# Patient Record
Sex: Male | Born: 1960 | Race: White | Hispanic: No | Marital: Married | Smoking: Former smoker
Health system: Southern US, Community
[De-identification: ages and names within clinical notes are randomized; demographics above are authoritative.]

## PROBLEM LIST (undated history)

## (undated) DIAGNOSIS — R569 Unspecified convulsions: Secondary | ICD-10-CM

## (undated) DIAGNOSIS — Z86010 Personal history of colonic polyps: Secondary | ICD-10-CM

## (undated) DIAGNOSIS — F17200 Nicotine dependence, unspecified, uncomplicated: Secondary | ICD-10-CM

## (undated) HISTORY — DX: Nicotine dependence, unspecified, uncomplicated: F17.200

## (undated) HISTORY — DX: Unspecified convulsions: R56.9

## (undated) HISTORY — PX: TONSILLECTOMY AND ADENOIDECTOMY: SHX28

## (undated) HISTORY — PX: CARDIAC CATHETERIZATION: SHX172

## (undated) HISTORY — PX: APPENDECTOMY: SHX54

## (undated) HISTORY — DX: Personal history of colonic polyps: Z86.010

---

## 2010-08-23 ENCOUNTER — Emergency Department: Payer: Self-pay | Admitting: Emergency Medicine

## 2010-12-15 HISTORY — PX: MEDIAL COLLATERAL LIGAMENT REPAIR, KNEE: SHX2019

## 2012-11-08 ENCOUNTER — Encounter: Payer: Self-pay | Admitting: Family Medicine

## 2012-11-08 ENCOUNTER — Ambulatory Visit (INDEPENDENT_AMBULATORY_CARE_PROVIDER_SITE_OTHER): Payer: Managed Care, Other (non HMO) | Admitting: Family Medicine

## 2012-11-08 VITALS — BP 136/86 | HR 88 | Ht 72.25 in | Wt 213.0 lb

## 2012-11-08 DIAGNOSIS — F17200 Nicotine dependence, unspecified, uncomplicated: Secondary | ICD-10-CM

## 2012-11-08 DIAGNOSIS — Z79899 Other long term (current) drug therapy: Secondary | ICD-10-CM

## 2012-11-08 DIAGNOSIS — R5381 Other malaise: Secondary | ICD-10-CM

## 2012-11-08 DIAGNOSIS — R5383 Other fatigue: Secondary | ICD-10-CM

## 2012-11-08 DIAGNOSIS — G40909 Epilepsy, unspecified, not intractable, without status epilepticus: Secondary | ICD-10-CM

## 2012-11-08 DIAGNOSIS — M5412 Radiculopathy, cervical region: Secondary | ICD-10-CM

## 2012-11-08 DIAGNOSIS — M62838 Other muscle spasm: Secondary | ICD-10-CM

## 2012-11-08 DIAGNOSIS — M542 Cervicalgia: Secondary | ICD-10-CM

## 2012-11-08 LAB — CBC WITH DIFFERENTIAL/PLATELET
Eosinophils Absolute: 0.1 10*3/uL (ref 0.0–0.7)
Hemoglobin: 14.7 g/dL (ref 13.0–17.0)
Lymphocytes Relative: 21 % (ref 12–46)
Lymphs Abs: 3.5 10*3/uL (ref 0.7–4.0)
MCH: 31.9 pg (ref 26.0–34.0)
MCV: 92.6 fL (ref 78.0–100.0)
Monocytes Relative: 7 % (ref 3–12)
Neutrophils Relative %: 71 % (ref 43–77)
RBC: 4.61 MIL/uL (ref 4.22–5.81)
WBC: 16.2 10*3/uL — ABNORMAL HIGH (ref 4.0–10.5)

## 2012-11-08 MED ORDER — METHOCARBAMOL 500 MG PO TABS: 500.0000 mg | ORAL_TABLET | Freq: Three times a day (TID) | ORAL | Status: DC

## 2012-11-08 MED ORDER — CYCLOBENZAPRINE HCL 10 MG PO TABS: 5.0000 mg | ORAL_TABLET | Freq: Every evening | ORAL | Status: DC | PRN

## 2012-11-08 MED ORDER — HYDROCODONE-ACETAMINOPHEN 5-500 MG PO TABS: 1.0000 | ORAL_TABLET | Freq: Four times a day (QID) | ORAL | Status: DC | PRN

## 2012-11-08 MED ORDER — METHYLPREDNISOLONE 4 MG PO KIT: PACK | ORAL | Status: DC
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
## 2012-11-08 NOTE — Patient Instructions (Signed)
Stop the Ibuprofen.  Take robaxin just during day (less sedating muscle relaxant) and flexeril in the evening (will make you sleepy).  Try doing range of motion/stretching of the neck after applying heat for 15-20 minutes, 2-3 times daily, as well as massage  We are referring you to neurologist to further assess your seizures and medications.

## 2012-11-09 LAB — COMPREHENSIVE METABOLIC PANEL
Albumin: 4.7 g/dL (ref 3.5–5.2)
Alkaline Phosphatase: 64 U/L (ref 39–117)
BUN: 12 mg/dL (ref 6–23)
CO2: 30 mEq/L (ref 19–32)
Glucose, Bld: 93 mg/dL (ref 70–99)
Total Bilirubin: 0.3 mg/dL (ref 0.3–1.2)

## 2012-11-09 LAB — LEVETIRACETAM LEVEL: Keppra (Levetiracetam): 23.3 ug/mL (ref 5.0–30.0)

## 2012-11-09 LAB — TSH: TSH: 0.5 u[IU]/mL (ref 0.350–4.500)

## 2012-11-22 ENCOUNTER — Encounter: Payer: Self-pay | Admitting: Family Medicine

## 2012-11-22 ENCOUNTER — Ambulatory Visit (INDEPENDENT_AMBULATORY_CARE_PROVIDER_SITE_OTHER): Payer: Managed Care, Other (non HMO) | Admitting: Family Medicine

## 2012-11-22 VITALS — BP 130/84 | HR 72 | Ht 72.5 in | Wt 217.0 lb

## 2012-11-22 DIAGNOSIS — M62838 Other muscle spasm: Secondary | ICD-10-CM

## 2012-11-22 DIAGNOSIS — M25512 Pain in left shoulder: Secondary | ICD-10-CM

## 2012-11-22 DIAGNOSIS — M25519 Pain in unspecified shoulder: Secondary | ICD-10-CM

## 2012-11-22 DIAGNOSIS — M5412 Radiculopathy, cervical region: Secondary | ICD-10-CM

## 2012-11-22 DIAGNOSIS — M542 Cervicalgia: Secondary | ICD-10-CM

## 2012-11-22 MED ORDER — OXYCODONE-ACETAMINOPHEN 5-325 MG PO TABS: 1.0000 | ORAL_TABLET | Freq: Four times a day (QID) | ORAL | Status: DC | PRN

## 2012-11-22 NOTE — Patient Instructions (Addendum)
You have orders for x-rays in the computer--you can go to [facility] at your convenience).    We are referring you to physical therapy.  If your pain isn't improving/responding to therapy, then we will proceed with MRI.  You can discuss this with neurologist, since you will be seeing them after having started therapy, but you may need a little longer to determine if it is helping.
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
## 2012-11-23 ENCOUNTER — Ambulatory Visit
Admission: RE | Admit: 2012-11-23 | Discharge: 2012-11-23 | Disposition: A | Discharge status: Home / Self Care | Payer: Managed Care, Other (non HMO) | Source: Ambulatory Visit | Attending: Family Medicine | Admitting: Family Medicine

## 2012-11-23 DIAGNOSIS — M542 Cervicalgia: Secondary | ICD-10-CM

## 2012-11-23 DIAGNOSIS — M5412 Radiculopathy, cervical region: Secondary | ICD-10-CM

## 2012-11-23 DIAGNOSIS — M25512 Pain in left shoulder: Secondary | ICD-10-CM

## 2012-11-25 ENCOUNTER — Ambulatory Visit: Payer: Managed Care, Other (non HMO) | Admitting: Physical Therapy

## 2013-01-24 ENCOUNTER — Encounter: Payer: Self-pay | Admitting: Family Medicine

## 2013-01-24 ENCOUNTER — Ambulatory Visit (INDEPENDENT_AMBULATORY_CARE_PROVIDER_SITE_OTHER): Payer: Managed Care, Other (non HMO) | Admitting: Family Medicine

## 2013-01-24 VITALS — BP 134/86 | HR 92 | Ht 72.5 in | Wt 224.0 lb

## 2013-01-24 DIAGNOSIS — R6882 Decreased libido: Secondary | ICD-10-CM

## 2013-01-24 DIAGNOSIS — F17200 Nicotine dependence, unspecified, uncomplicated: Secondary | ICD-10-CM

## 2013-01-24 DIAGNOSIS — R5383 Other fatigue: Secondary | ICD-10-CM

## 2013-01-24 DIAGNOSIS — Z0000 Encounter for general adult medical examination without abnormal findings: Secondary | ICD-10-CM

## 2013-01-24 DIAGNOSIS — R7301 Impaired fasting glucose: Secondary | ICD-10-CM | POA: Insufficient documentation

## 2013-01-24 DIAGNOSIS — R195 Other fecal abnormalities: Secondary | ICD-10-CM

## 2013-01-24 DIAGNOSIS — R5381 Other malaise: Secondary | ICD-10-CM

## 2013-01-24 DIAGNOSIS — Z13220 Encounter for screening for lipoid disorders: Secondary | ICD-10-CM

## 2013-01-24 DIAGNOSIS — Z23 Encounter for immunization: Secondary | ICD-10-CM

## 2013-01-24 DIAGNOSIS — E782 Mixed hyperlipidemia: Secondary | ICD-10-CM | POA: Insufficient documentation

## 2013-01-24 DIAGNOSIS — Z125 Encounter for screening for malignant neoplasm of prostate: Secondary | ICD-10-CM

## 2013-01-24 DIAGNOSIS — Z1211 Encounter for screening for malignant neoplasm of colon: Secondary | ICD-10-CM

## 2013-01-24 LAB — GLUCOSE, RANDOM: Glucose, Bld: 161 mg/dL — ABNORMAL HIGH (ref 70–99)

## 2013-01-24 LAB — POCT URINALYSIS DIPSTICK
Leukocytes, UA: NEGATIVE
Nitrite, UA: NEGATIVE
Protein, UA: NEGATIVE
Urobilinogen, UA: NEGATIVE
pH, UA: 5

## 2013-01-24 LAB — PSA: PSA: 0.64 ng/mL (ref <=4.00)

## 2013-01-24 LAB — LIPID PANEL: Total CHOL/HDL Ratio: 8.5 Ratio
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
## 2013-01-24 NOTE — Patient Instructions (Addendum)
HEALTH MAINTENANCE RECOMMENDATIONS:  It is recommended that you get at least 30 minutes of aerobic exercise at least 5 days/week (for weight loss, you may need as much as 60-90 minutes). This can be any activity that gets your heart rate up. This can be divided in 10-15 minute intervals if needed, but try and build up your endurance at least once a week.  Weight bearing exercise is also recommended twice weekly.  Eat a healthy diet with lots of vegetables, fruits and fiber.  "Colorful" foods have a lot of vitamins (ie green vegetables, tomatoes, red peppers, etc).  Limit sweet tea, regular sodas and alcoholic beverages, all of which has a lot of calories and sugar.  Up to 2 alcoholic drinks daily may be beneficial for men (unless trying to lose weight, watch sugars).  Drink a lot of water.  Sunscreen of at least SPF 30 should be used on all sun-exposed parts of the skin when outside between the hours of 10 am and 4 pm (not just when at beach or pool, but even with exercise, golf, tennis, and yard work!)  Use a sunscreen that says "broad spectrum" so it covers both UVA and UVB rays, and make sure to reapply every 1-2 hours.  Remember to change the batteries in your smoke detectors when changing your clock times in the spring and fall.  Use your seat belt every time you are in a car, and please drive safely and not be distracted with cell phones and texting while driving.  Please schedule routine eye exam. Please quit smoking--you can use 1-800-QUITNOW or [facility]uit[facility] for free counseling to help quit smoking. Flu shots are recommended every fall  We are referring you for colonoscopy.

## 2013-01-25 DIAGNOSIS — R195 Other fecal abnormalities: Secondary | ICD-10-CM | POA: Insufficient documentation

## 2013-01-25 NOTE — Progress Notes (Signed)
Quick Note:  PATIENT INFORMED WORD FOR WORD AND HAS APT. FEB.17 AT 3 ______

## 2013-01-26 ENCOUNTER — Encounter: Payer: Self-pay | Admitting: Internal Medicine

## 2013-01-31 ENCOUNTER — Ambulatory Visit (INDEPENDENT_AMBULATORY_CARE_PROVIDER_SITE_OTHER): Payer: Managed Care, Other (non HMO) | Admitting: Family Medicine

## 2013-01-31 ENCOUNTER — Encounter: Payer: Self-pay | Admitting: Family Medicine

## 2013-01-31 VITALS — BP 130/80 | HR 88 | Ht 72.5 in | Wt 222.0 lb

## 2013-01-31 DIAGNOSIS — F17200 Nicotine dependence, unspecified, uncomplicated: Secondary | ICD-10-CM

## 2013-01-31 DIAGNOSIS — E291 Testicular hypofunction: Secondary | ICD-10-CM

## 2013-01-31 DIAGNOSIS — R7301 Impaired fasting glucose: Secondary | ICD-10-CM

## 2013-01-31 DIAGNOSIS — E782 Mixed hyperlipidemia: Secondary | ICD-10-CM

## 2013-01-31 LAB — POCT GLYCOSYLATED HEMOGLOBIN (HGB A1C): Hemoglobin A1C: 5.7

## 2013-01-31 NOTE — Progress Notes (Signed)
Chief Complaint  Patient presents with  . Follow-up    lab follow up.   He reports that his labwork done at his physical wasn't actually fasting-- he had sugar in his coffee that morning.  Hadn't eaten anything, just sugar in his coffee.  He presents today to discuss his elevated blood sugar and lipids.  His exercise includes being very active on the job.  He is a smoker.  Past Medical History  Diagnosis Date  . Seizure     first 2009, total of 5.  [place] [facility]aggard    Past Surgical History  Procedure Laterality Date  . Tonsillectomy and adenoidectomy  age 52  . Appendectomy    . Medial collateral ligament repair, knee  2012    LEFT   History   Social History  . Marital Status: Single    Spouse Name: N/A    Number of Children: N/A  . Years of Education: N/A   Occupational History  . 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 Smoking status: Current Every Day Smoker -- 1.00 packs/day    Types: Cigarettes  . Smokeless tobacco: Not on file     Comment: smoking since age 10  . Alcohol Use: Yes     Comment: states he will go months without drinking, drank this Friday. sporadic  . Drug Use: No  . Sexually Active: Yes -- Male partner(s)   Other Topics Concern  . Not on file   Social History Narrative   Lives with his Earlie Counts.  3 kids, live in [place]   Current Outpatient Prescriptions on File Prior to Visit  Medication Sig Dispense Refill  . carbamazepine (TEGRETOL XR) 200 MG 12 hr tablet Take 200 mg by mouth 2 (two) times daily.      . Levetiracetam (KEPPRA XR) 750 MG TB24 Take 1,500 mg by mouth 2 (two) times daily.      . methocarbamol (ROBAXIN) 500 MG tablet Take 1-2 tablets (500-1,000 mg total) by mouth 3 (three) times daily. Take as needed for muscle spasm, during day  30 tablet  0  . sertraline (ZOLOFT) 50 MG tablet Take 50 mg by mouth daily.       No current facility-administered medications on file  prior to visit.   Allergies  Allergen Reactions  . Penicillins Hives and Itching  . Vicodin (Hydrocodone-Acetaminophen) Itching    ROS:  +fatigue.  Denies snoring (very rare).  Feels tired a lot.  Denies chest pain, URI symtpoms, fevers, shortness of breath or other concerns.  PHYSICAL EXAM: BP 130/80  Pulse 88  Ht 6' 0.5" (1.842 m)  Wt 222 lb (100.699 kg)  BMI 29.68 kg/m2 Pleasant, very talkative male in no distress (frequently interrupts)  Labs: Lab Results  Component Value Date   HGBA1C 5.7 01/31/2013   Lab Results  Component Value Date   CHOL 289* 01/24/2013   HDL 34* 01/24/2013   LDLCALC Comment:   Not calculated due to Triglyceride >400. Suggest ordering Direct LDL (Unit Code: [address]).   Total Cholesterol/HDL Ratio:CHD Risk                        Coronary Heart Disease Risk Table                                        Men  Women          1/2 Average Risk              3.4        3.3              Average Risk              5.0        4.4           2X Average Risk              9.6        7.1           3X Average Risk             23.4       11.0 Use the calculated Patient Ratio above and the CHD Risk table  to determine the patient's CHD Risk. ATP III Classification (LDL):       < 100        mg/dL         Optimal      161 - 129     mg/dL         Near or Above Optimal      130 - 159     mg/dL         Borderline High      160 - 189     mg/dL         High       > 096        mg/dL         Very High   0/45/4098   TRIG 442* 01/24/2013   CHOLHDL 8.5 01/24/2013   Testosterone 310 Glucose 161  ASSESSMENT/PLAN:  Mixed hyperlipidemia - Diet reviewed extensively.  Encouraged tobacco cessation, low cholesterol, lowfat diet.  May need meds if not improved with diet alone.  recheck in 3-4 months. - Plan: Lipid panel  Impaired fasting glucose - A1c okay.  apparently wasn't truly fasting.  Diet reviewed at length. - Plan: HgB A1c, Glucose, random  Tobacco use disorder - smoking cessation  encouraged.  might help increase HDL (as well as cardiovascular benefits, as reviewed previously)  Hypogonadism male - level not low enough for insurance to cover replacement.  consider repeat in future.  weight loss encouraged.  Offered nutritionist--he will let us know if referral is desired.

## 2013-01-31 NOTE — Patient Instructions (Signed)
Lab Results  Component Value Date   HGBA1C 5.7 01/31/2013   Lab Results  Component Value Date   CHOL 289* 01/24/2013   HDL 34* 01/24/2013   LDLCALC Comment:   Not calculated due to Triglyceride >400. Suggest ordering Direct LDL (Unit Code: [address]).   Total Cholesterol/HDL Ratio:CHD Risk                        Coronary Heart Disease Risk Table                                        Men       Women          1/2 Average Risk              3.4        3.3              Average Risk              5.0        4.4           2X Average Risk              9.6        7.1           3X Average Risk             23.4       11.0 Use the calculated Patient Ratio above and the CHD Risk table  to determine the patient's CHD Risk. ATP III Classification (LDL):       < 100        mg/dL         Optimal      604 - 129     mg/dL         Near or Above Optimal      130 - 159     mg/dL         Borderline High      160 - 189     mg/dL         High       > 540        mg/dL         Very High   9/81/1914   TRIG 442* 01/24/2013   CHOLHDL 8.5 01/24/2013   Testosterone 310  Glucose 161  Fat and Cholesterol Control Diet Cholesterol levels in your body are determined significantly by your diet. Cholesterol levels may also be related to heart disease. The following material helps to explain this relationship and discusses what you can do to help keep your heart healthy. Not all cholesterol is bad. Low-density lipoprotein (LDL) cholesterol is the "bad" cholesterol. It may cause fatty deposits to build up inside your arteries. High-density lipoprotein (HDL) cholesterol is "good." It helps to remove the "bad" LDL cholesterol from your blood. Cholesterol is a very important risk factor for heart disease. Other risk factors are high blood pressure, smoking, stress, heredity, and weight. The heart muscle gets its supply of blood through the coronary arteries. If your LDL cholesterol is high and your HDL cholesterol is low, you are at risk for  having fatty deposits build up in your coronary arteries. This leaves less room through which blood can flow. Without sufficient blood and oxygen, the heart muscle cannot function properly  and you may feel chest pains (angina pectoris). When a coronary artery closes up entirely, a part of the heart muscle may die causing a heart attack (myocardial infarction). CHECKING CHOLESTEROL When your caregiver sends your blood to a lab to be examined for cholesterol, a complete lipid (fat) profile may be done. With this test, the total amount of cholesterol and levels of LDL and HDL are determined. Triglycerides are a type of fat that circulates in the blood. They can also be used to determine heart disease risk. The list below describes what the numbers should be: Test: Total Cholesterol.  Less than 200 mg/dl. Test: LDL "bad cholesterol."  Less than 100 mg/dl.  Less than 70 mg/dl if you are at very high risk of a heart attack or sudden cardiac death. Test: HDL "good cholesterol."  Greater than 50 mg/dl for women.  Greater than 40 mg/dl for men. Test: Triglycerides.  Less than 150 mg/dl. CONTROLLING CHOLESTEROL WITH DIET Although exercise and lifestyle factors are important, your diet is key. That is because certain foods are known to raise cholesterol and others to lower it. The goal is to balance foods for their effect on cholesterol and more importantly, to replace saturated and trans fat with other types of fat, such as monounsaturated fat, polyunsaturated fat, and omega-3 fatty acids. On average, a person should consume no more than 15 to 17 g of saturated fat daily. Saturated and trans fats are considered "bad" fats, and they will raise LDL cholesterol. Saturated fats are primarily found in animal products such as meats, butter, and cream. However, that does not mean you need to give up all your favorite foods. Today, there are good tasting, low-fat, low-cholesterol substitutes for most of the  things you like to eat. Choose low-fat or nonfat alternatives. Choose round or loin cuts of red meat. These types of cuts are lowest in fat and cholesterol. Chicken (without the skin), fish, veal, and ground [place] breast are great choices. Eliminate fatty meats, such as hot dogs and salami. Even shellfish have little or no saturated fat. Have a 3 oz (85 g) portion when you eat lean meat, poultry, or fish. Trans fats are also called "partially hydrogenated oils." They are oils that have been scientifically manipulated so that they are solid at room temperature resulting in a longer shelf life and improved taste and texture of foods in which they are added. Trans fats are found in stick margarine, some tub margarines, cookies, crackers, and baked goods.  When baking and cooking, oils are a great substitute for butter. The monounsaturated oils are especially beneficial since it is believed they lower LDL and raise HDL. The oils you should avoid entirely are saturated tropical oils, such as coconut and palm.  Remember to eat a lot from food groups that are naturally free of saturated and trans fat, including fish, fruit, vegetables, beans, grains (barley, rice, couscous, bulgur wheat), and pasta (without cream sauces).  IDENTIFYING FOODS THAT LOWER CHOLESTEROL  Soluble fiber may lower your cholesterol. This type of fiber is found in fruits such as apples, vegetables such as broccoli, potatoes, and carrots, legumes such as beans, peas, and lentils, and grains such as barley. Foods fortified with plant sterols (phytosterol) may also lower cholesterol. You should eat at least 2 g per day of these foods for a cholesterol lowering effect.  Read package labels to identify low-saturated fats, trans fat free, and low-fat foods at the supermarket. Select cheeses that have only 2 to 3  g saturated fat per ounce. Use a heart-healthy tub margarine that is free of trans fats or partially hydrogenated oil. When buying baked  goods (cookies, crackers), avoid partially hydrogenated oils. Breads and muffins should be made from whole grains (whole-wheat or whole oat flour, instead of "flour" or "enriched flour"). Buy non-creamy canned soups with reduced salt and no added fats.  FOOD PREPARATION TECHNIQUES  Never deep-fry. If you must fry, either stir-fry, which uses very little fat, or use non-stick cooking sprays. When possible, broil, bake, or roast meats, and steam vegetables. Instead of putting butter or margarine on vegetables, use lemon and herbs, applesauce, and cinnamon (for squash and sweet potatoes), nonfat yogurt, salsa, and low-fat dressings for salads.  LOW-SATURATED FAT / LOW-FAT FOOD SUBSTITUTES Meats / Saturated Fat (g)  Avoid: Steak, marbled (3 oz/85 g) / 11 g  Choose: Steak, lean (3 oz/85 g) / 4 g  Avoid: Hamburger (3 oz/85 g) / 7 g  Choose: Hamburger, lean (3 oz/85 g) / 5 g  Avoid: Ham (3 oz/85 g) / 6 g  Choose: Ham, lean cut (3 oz/85 g) / 2.4 g  Avoid: Chicken, with skin, dark meat (3 oz/85 g) / 4 g  Choose: Chicken, skin removed, dark meat (3 oz/85 g) / 2 g  Avoid: Chicken, with skin, light meat (3 oz/85 g) / 2.5 g  Choose: Chicken, skin removed, light meat (3 oz/85 g) / 1 g Dairy / Saturated Fat (g)  Avoid: Whole milk (1 cup) / 5 g  Choose: Low-fat milk, 2% (1 cup) / 3 g  Choose: Low-fat milk, 1% (1 cup) / 1.5 g  Choose: Skim milk (1 cup) / 0.3 g  Avoid: Hard cheese (1 oz/28 g) / 6 g  Choose: Skim milk cheese (1 oz/28 g) / 2 to 3 g  Avoid: Cottage cheese, 4% fat (1 cup) / 6.5 g  Choose: Low-fat cottage cheese, 1% fat (1 cup) / 1.5 g  Avoid: Ice cream (1 cup) / 9 g  Choose: Sherbet (1 cup) / 2.5 g  Choose: Nonfat frozen yogurt (1 cup) / 0.3 g  Choose: Frozen fruit bar / trace  Avoid: Whipped cream (1 tbs) / 3.5 g  Choose: Nondairy whipped topping (1 tbs) / 1 g Condiments / Saturated Fat (g)  Avoid: Mayonnaise (1 tbs) / 2 g  Choose: Low-fat mayonnaise (1 tbs) / 1  g  Avoid: Butter (1 tbs) / 7 g  Choose: Extra light margarine (1 tbs) / 1 g  Avoid: Coconut oil (1 tbs) / 11.8 g  Choose: Olive oil (1 tbs) / 1.8 g  Choose: Corn oil (1 tbs) / 1.7 g  Choose: Safflower oil (1 tbs) / 1.2 g  Choose: Sunflower oil (1 tbs) / 1.4 g  Choose: Soybean oil (1 tbs) / 2.4 g  Choose: Canola oil (1 tbs) / 1 g Document Released: 12/01/2005 Document Revised: 02/23/2012 Document Reviewed: 05/22/2011 [facility] Patient Information 2013 [place], [place].  Start taking 3000-4000 mg of omega-3 fish oil daily. Avoid fried foods, and limit sweets and cut back on carbs (using "brown" carbs vs white)  Return to recheck cholesterol and sugar in 3-4 months.  If still elevated, we will likely need to start medication.  Please make sure to have NOTHING except BLACK coffee or water prior to your labs.

## 2013-02-28 ENCOUNTER — Ambulatory Visit (AMBULATORY_SURGERY_CENTER): Payer: Managed Care, Other (non HMO) | Admitting: *Deleted

## 2013-02-28 ENCOUNTER — Encounter: Payer: Self-pay | Admitting: Internal Medicine

## 2013-02-28 VITALS — Ht 73.0 in | Wt 223.8 lb

## 2013-02-28 DIAGNOSIS — Z1211 Encounter for screening for malignant neoplasm of colon: Secondary | ICD-10-CM

## 2013-02-28 MED ORDER — NA SULFATE-K SULFATE-MG SULF 17.5-3.13-1.6 GM/177ML PO SOLN: ORAL | Status: DC

## 2013-03-07 ENCOUNTER — Encounter: Payer: Self-pay | Admitting: Family Medicine

## 2013-03-07 ENCOUNTER — Ambulatory Visit (INDEPENDENT_AMBULATORY_CARE_PROVIDER_SITE_OTHER): Payer: Managed Care, Other (non HMO) | Admitting: Family Medicine

## 2013-03-07 VITALS — BP 138/88 | HR 76 | Temp 97.9°F | Ht 73.75 in | Wt 224.0 lb

## 2013-03-07 DIAGNOSIS — F17200 Nicotine dependence, unspecified, uncomplicated: Secondary | ICD-10-CM

## 2013-03-07 DIAGNOSIS — J019 Acute sinusitis, unspecified: Secondary | ICD-10-CM

## 2013-03-07 MED ORDER — AZITHROMYCIN 250 MG PO TABS: ORAL_TABLET | ORAL | Status: AC

## 2013-03-07 NOTE — Patient Instructions (Addendum)
Drink plenty of fluids. Continue to use Mucinex-D as directed. If yours isn't Mucinex-D, what you want to take is guaifenesin (2400 mg/day) and decongestant--either pseudoephedrine or phenylephrine. You may use ibuprofen and/or tylenol as needed for pain Take the antibiotics as directed--it might take 5-6 days to really start helping, call us on day 10 if you aren't completely better (even though you are finished with the antibiotics on day 5, it continues to work for 10 days total)  Please try and quit smoking--start thinking about why/when you smoke (habit, boredom, stress) in order to come up with effective strategies to cut back or quit. Available resources to help you quit include free counseling through [facility] Quitline ([place]Quitline.com or 1-800-QUITNOW), smoking cessation classes through [facility] (call to find out schedule), over-the-counter nicotine replacements, and e-cigarettes (although this may not help break the hand-mouth habit).  Many insurance companies also have smoking cessation programs (which may decrease the cost of patches, meds if enrolled).  If these methods are not effective for you, and you are motivated to quit, return to discuss the possibility of prescription medications.

## 2013-03-07 NOTE — Progress Notes (Signed)
Chief Complaint  Patient presents with  . sinus infection    sinus infection since last monday. hurts under right eye   HPI:  Felt a tickle starting in his throat last week, then developed into head congestion--"cotton in ears, balloon head", pain under both eyes.  Nasal mucus is yellow.  Denies sore throat, only slight cough.  Some intermittent R ear pain, hurting along with the sinus pressure.  Used alka selzer cold and flu, but wasn't helpful, so changed to Mucinex--doesn't think it had decongestant.  He continues to smoke, although has been smoking less since he has been sick.  Past Medical History  Diagnosis Date  . Seizure     first 2009, total of 5. last seizure 11/13  . Smoker    History   Social History  . Marital Status: Single    Spouse Name: N/A    Number of Children: N/A  . Years of Education: N/A   Occupational History  . 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 Smoking status: Current Every Day Smoker -- 1.00 packs/day    Types: Cigarettes  . Smokeless tobacco: Never Used     Comment: smoking since age 52  . Alcohol Use: Yes     Comment: states he will go months without drinking, drank this Friday. sporadic  . Drug Use: No  . Sexually Active: Yes -- Male partner(s)   Other Topics Concern  . Not on file   Social History Narrative   Lives with his Earlie Counts.  3 kids, live in [place]   Current outpatient prescriptions:carbamazepine (TEGRETOL XR) 200 MG 12 hr tablet, Take 200 mg by mouth 2 (two) times daily., Disp: , Rfl: ;  Ibuprofen (ADVIL) 200 MG CAPS, Take by mouth as needed., Disp: , Rfl: ;  Levetiracetam (KEPPRA XR) 750 MG TB24, Take 1,500 mg by mouth 2 (two) times daily., Disp: , Rfl: ;  Multiple Vitamin (MULTIVITAMIN) tablet, Take 1 tablet by mouth daily., Disp: , Rfl:  Pseudoephedrine-Guaifenesin (MUCINEX D PO), Take by mouth., Disp: , Rfl: ;  Na Sulfate-K Sulfate-Mg Sulf (SUPREP BOWEL  PREP) SOLN, suprep as directed.  No substitutions, Disp: 354 mL, Rfl: 0  Allergies  Allergen Reactions  . Penicillins Hives and Itching  . Vicodin (Hydrocodone-Acetaminophen) Itching   ROS:  Denies headaches (other than sinus), dizziness, chest pain, shortness of breath, nausea, vomiting, diarrhea, skin rash, myalgias or other concerns.  PHYSICAL EXAM: BP 138/88  Pulse 76  Temp(Src) 97.9 F (36.6 C) (Oral)  Ht 6' 1.75" (1.873 m)  Wt 224 lb (101.606 kg)  BMI 28.96 kg/m2 Well developed male, in no distress.  Appears frustrated that he isn't feeling well. HEENT:  PERRL, EOMI, conjunctiva clear.  TM's and EAC's normal.  OP clear. Nasal mucosa mod-severely edematous with some erythema. No purulence noted.  +TTP bilateral maxillary sinuses Neck: no lymphadenopathy Heart: regular rate and rhythm without murmur Lungs: clear bilaterally Skin: no rash  ASSESSMENT/PLAN:  Sinusitis, acute - Plan: azithromycin (ZITHROMAX Z-PAK) 250 MG tablet  Tobacco use disorder  Drink plenty of fluids. Continue to use Mucinex-D as directed. If yours isn't Mucinex-D, what you want to take is guaifenesin (2400 mg/day) and decongestant--either pseudoephedrine or phenylephrine. You may use ibuprofen and/or tylenol as needed for pain Take the antibiotics as directed--it might take 5-6 days to really start helping, call us on day 10 if you aren't completely better (even though you are finished with the antibiotics on  day 5, it continues to work for 10 days total)  Encouraged to quit smoking

## 2013-03-14 ENCOUNTER — Ambulatory Visit (AMBULATORY_SURGERY_CENTER): Payer: Managed Care, Other (non HMO) | Admitting: Internal Medicine

## 2013-03-14 ENCOUNTER — Encounter: Payer: Self-pay | Admitting: Internal Medicine

## 2013-03-14 VITALS — BP 113/70 | HR 67 | Temp 96.9°F | Resp 13 | Ht 73.0 in | Wt 223.0 lb

## 2013-03-14 DIAGNOSIS — K5730 Diverticulosis of large intestine without perforation or abscess without bleeding: Secondary | ICD-10-CM

## 2013-03-14 DIAGNOSIS — D126 Benign neoplasm of colon, unspecified: Secondary | ICD-10-CM

## 2013-03-14 DIAGNOSIS — Z1211 Encounter for screening for malignant neoplasm of colon: Secondary | ICD-10-CM

## 2013-03-14 MED ORDER — SODIUM CHLORIDE 0.9 % IV SOLN: 500.0000 mL | INTRAVENOUS | Status: DC

## 2013-03-14 NOTE — Patient Instructions (Addendum)
I found and removed one polyp today. It looks benign.  You also have diverticulosis - a common condition that usually does not cause problems.  I will let you know pathology results and when to have another routine colonoscopy by mail.  Thank you for choosing me and [facility].  Iva Boop, MD, [facility]  Discharge instructions given with verbal understanding. Handouts on polyps and diverticulosis. Resume previous medications. YOU HAD AN ENDOSCOPIC PROCEDURE TODAY AT THE [facility]: Refer to the procedure report that was given to you for any specific questions about what was found during the examination.  If the procedure report does not answer your questions, please call your gastroenterologist to clarify.  If you requested that your care partner not be given the details of your procedure findings, then the procedure report has been included in a sealed envelope for you to review at your convenience later.  YOU SHOULD EXPECT: Some feelings of bloating in the abdomen. Passage of more gas than usual.  Walking can help get rid of the air that was put into your GI tract during the procedure and reduce the bloating. If you had a lower endoscopy (such as a colonoscopy or flexible sigmoidoscopy) you may notice spotting of blood in your stool or on the toilet paper. If you underwent a bowel prep for your procedure, then you may not have a normal bowel movement for a few days.  DIET: Your first meal following the procedure should be a light meal and then it is ok to progress to your normal diet.  A half-sandwich or bowl of soup is an example of a good first meal.  Heavy or fried foods are harder to digest and may make you feel nauseous or bloated.  Likewise meals heavy in dairy and vegetables can cause extra gas to form and this can also increase the bloating.  Drink plenty of fluids but you should avoid alcoholic beverages for 24 hours.  ACTIVITY: Your care partner should take  you home directly after the procedure.  You should plan to take it easy, moving slowly for the rest of the day.  You can resume normal activity the day after the procedure however you should NOT DRIVE or use heavy machinery for 24 hours (because of the sedation medicines used during the test).    SYMPTOMS TO REPORT IMMEDIATELY: A gastroenterologist can be reached at any hour.  During normal business hours, 8:30 AM to 5:00 PM Monday through Friday, call [phone].  After hours and on weekends, please call the GI answering service at [phone] who will take a message and have the physician on call contact you.   Following lower endoscopy (colonoscopy or flexible sigmoidoscopy):  Excessive amounts of blood in the stool  Significant tenderness or worsening of abdominal pains  Swelling of the abdomen that is new, acute  Fever of 100F or higher  FOLLOW UP: If any biopsies were taken you will be contacted by phone or by letter within the next 1-3 weeks.  Call your gastroenterologist if you have not heard about the biopsies in 3 weeks.  Our staff will call the home number listed on your records the next business day following your procedure to check on you and address any questions or concerns that you may have at that time regarding the information given to you following your procedure. This is a courtesy call and so if there is no answer at the home number and we have not heard  from you through the emergency physician on call, we will assume that you have returned to your regular daily activities without incident.  SIGNATURES/CONFIDENTIALITY: You and/or your care partner have signed paperwork which will be entered into your electronic medical record.  These signatures attest to the fact that that the information above on your After Visit Summary has been reviewed and is understood.  Full responsibility of the confidentiality of this discharge information lies with you and/or your  care-partner.

## 2013-03-14 NOTE — Op Note (Signed)
[facility] [address] [place], [address]   COLONOSCOPY PROCEDURE REPORT  PATIENT: Nicholas Nguyen, Nicholas Nguyen  MR#: [id number] BIRTHDATE: August 17, 1961 , 51  yrs. old GENDER: Male ENDOSCOPIST: Iva Boop, MD, [facility] REFERRED Edison Simon, M.D. PROCEDURE DATE:  03/14/2013 PROCEDURE:   Colonoscopy with snare polypectomy ASA CLASS:   Class II INDICATIONS:average risk screening. MEDICATIONS: propofol (Diprivan) 500mg  IV, MAC sedation, administered by CRNA, and These medications were titrated to patient response per physician's verbal order  DESCRIPTION OF PROCEDURE:   After the risks benefits and alternatives of the procedure were thoroughly explained, informed consent was obtained.  A digital rectal exam revealed no abnormalities of the rectum, A digital rectal exam revealed no prostatic nodules, and A digital rectal exam revealed the prostate was not enlarged.   The LB [id number]  endoscope was introduced through the anus and advanced to the cecum, which was identified by both the appendix and ileocecal valve. No adverse events experienced.   The quality of the prep was Suprep excellent The instrument was then slowly withdrawn as the colon was fully examined.      COLON FINDINGS: A polypoid shaped sessile polyp measuring 1 cm in size was found in the sigmoid colon.  A polypectomy was performed using snare cautery.  The resection was complete and the polyp tissue was completely retrieved.   Moderate diverticulosis was noted in the sigmoid colon.   The colon mucosa was otherwise normal.   A right colon retroflexion was performed.  Retroflexed views revealed no abnormalities. The time to cecum=6 minutes 39 seconds.  Withdrawal time=11 minutes 45 seconds.  The scope was withdrawn and the procedure completed. COMPLICATIONS: There were no complications.  ENDOSCOPIC IMPRESSION: 1.   Sessile polyp measuring 1 cm in size was found in the sigmoid colon; polypectomy  was performed using snare cautery 2.   Moderate diverticulosis was noted in the sigmoid colon 3.   The colon mucosa was otherwise normal - excellent prep  RECOMMENDATIONS: 1.  Timing of repeat colonoscopy will be determined by pathology findings. 2.  Hold aspirin, aspirin products, and anti-inflammatory medication for 1 week.   eSigned:  Iva Boop, MD, [facility] 03/14/2013 9:00 AM   cc: Joselyn Arrow, MD and The Patient

## 2013-03-14 NOTE — Progress Notes (Signed)
Patient did not experience any of the following events: a burn prior to discharge; a fall within the facility; wrong site/side/patient/procedure/implant event; or a hospital transfer or hospital admission upon discharge from the facility. (G8907) Patient did not have preoperative order for IV antibiotic SSI prophylaxis. (G8918)  

## 2013-03-14 NOTE — Progress Notes (Signed)
Called to room to assist during endoscopic procedure.  Patient ID and intended procedure confirmed with present staff. Received instructions for my participation in the procedure from the performing physician.  

## 2013-03-15 ENCOUNTER — Telehealth: Payer: Self-pay | Admitting: *Deleted

## 2013-03-15 NOTE — Telephone Encounter (Signed)
  Follow up Call-  Call back number 03/14/2013  Post procedure Call Back phone  # [phone]  Permission to leave phone message Yes     Patient questions:  Do you have a fever, pain , or abdominal swelling? yes Pain Score  2 *  Have you tolerated food without any problems? yes  Have you been able to return to your normal activities? yes  Do you have any questions about your discharge instructions: Diet   no Medications  no Follow up visit  no  Do you have questions or concerns about your Care? no  Actions: * If pain score is 4 or above: No action needed, pain <4.

## 2013-03-20 ENCOUNTER — Encounter: Payer: Self-pay | Admitting: Internal Medicine

## 2013-03-20 DIAGNOSIS — Z860100 Personal history of colon polyps, unspecified: Secondary | ICD-10-CM

## 2013-03-20 HISTORY — DX: Personal history of colonic polyps: Z86.010

## 2013-03-20 HISTORY — DX: Personal history of colon polyps, unspecified: Z86.0100

## 2013-03-20 NOTE — Progress Notes (Signed)
Quick Note:  1cm tubulovillous adenoma Repeat colonoscopy about 03/2016 ______

## 2013-04-04 ENCOUNTER — Ambulatory Visit (INDEPENDENT_AMBULATORY_CARE_PROVIDER_SITE_OTHER): Payer: Managed Care, Other (non HMO) | Admitting: Family Medicine

## 2013-04-04 ENCOUNTER — Encounter: Payer: Self-pay | Admitting: Family Medicine

## 2013-04-04 VITALS — Temp 98.5°F | Ht 75.75 in | Wt 219.0 lb

## 2013-04-04 DIAGNOSIS — L259 Unspecified contact dermatitis, unspecified cause: Secondary | ICD-10-CM

## 2013-04-04 MED ORDER — PREDNISONE 10 MG PO TABS: ORAL_TABLET | ORAL | Status: DC

## 2013-04-04 NOTE — Progress Notes (Signed)
Chief Complaint  Patient presents with  . Rash    first noticed this past Thursday and is getting increasingly worse.   4 days ago first noticed a few spots on his arms. These were itchy.  It has progressively spread, but much worse today. This morning he noticed some red spots on his feet, but now the top of his left foot is all blistered, weeping and feels like it is on fire.  Today has noticed rash over abdomen, back.  It started on the forearms near his posterior elbows, bilaterally.  He is itchy everywhere, painful/burning.  Denies any recent yardwork, new exposures, soaps, detergents, no travel. Hasn't worn sandals.  Has an indoor cat--hasn't been outside, no new topical meds for cat.  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  He hasn't used any OTC meds.  No change in his medications (no change in manufacturer).  Past Medical History  Diagnosis Date  . Seizure     first 2009, total of 5. last seizure 11/13  . Smoker   . Personal history of colonic adenoma 03/20/2013   Past Surgical History  Procedure Laterality Date  . Tonsillectomy and adenoidectomy  age 51  . Appendectomy    . Medial collateral ligament repair, knee  2012    LEFT   Current Outpatient Prescriptions on File Prior to Visit  Medication Sig Dispense Refill  . carbamazepine (TEGRETOL XR) 200 MG 12 hr tablet Take 200 mg by mouth 2 (two) times daily.      . Levetiracetam (KEPPRA XR) 750 MG TB24 Take 1,500 mg by mouth 2 (two) times daily.      . Multiple Vitamin (MULTIVITAMIN) tablet Take 1 tablet by mouth daily.      . Ibuprofen (ADVIL) 200 MG CAPS Take by mouth as needed.       No current facility-administered medications on file prior to visit.   Allergies  Allergen Reactions  . Penicillins Hives and Itching  . Vicodin (Hydrocodone-Acetaminophen) Itching   ROS:  Denies fevers, URI symptoms, shortness of breath, nausea, vomiting,  bowel changes.  Denies seizures, headaches, dizziness, myalgias or other concerns.  PHYSICAL EXAM: Temp(Src) 98.5 F (36.9 C) (Oral)  Ht 6' 3.75" (1.924 m)  Wt 219 lb (99.338 kg)  BMI 26.84 kg/m2  Appears agitated, uncomfortable, itching all over.  Skin: Fine papular rash scattered across abdomen and back, without excoriations (a few lesions on arms are excoriated/scabbed). No weeping or erythema. Most affected area is top of left foot--these lesions are vesicular, weeping.  Mild erythema but no edema, no proximal spread of erythema.    ASSESSMENT/PLAN:  Contact dermatitis - Plan: predniSONE (DELTASONE) 10 MG tablet  Unable to determine from history the cause of this dermatitis, but given the vesicles and pruritis, suspect allergic reaction.  Do not suspect infection.  Cannot r/o allergic reaction to medication, but history doesn't support this (no change in meds)  Treat with oral steroids.  Risks and side effects were reviewed in detail.  Follow up if symptoms, persist, worsen.  Other supportive measures were reviewed.  Prednisone 60/50/40/30/30/20/20/20/10/10/5/5  F/u prn

## 2013-04-04 NOTE — Patient Instructions (Addendum)
Prednisone 10mg  tablet  Take 6 tablets all at once today. Then tomorrow morning take 5 tablets. (Tuesday)--take with food. Wednesday morning take 4 tablets Thursday morning that 3 tablets Fri--take 3 Saturday--take 2 Sunday take 2 Monday take 2 Tuesday take 1 tab Wed--1 tab Thur and Fri--take 1/2 tablet each day.  You can use tylenol if needed for pain.  Avoid ibuprofen and aleve as this will bother your stomach. You can use benadryl or zyrtec if needed for itching, or to help you sleep, if needed.  Use topical antibacterial ointment to open areas of skin (especially left foot).

## 2013-04-15 ENCOUNTER — Telehealth: Payer: Self-pay | Admitting: Family Medicine

## 2013-04-15 NOTE — Telephone Encounter (Signed)
If no benefit from the prednisone, he needs to be seen.  I recommend derm referral--see if someone can see him today (if he is willing), or see if another provider in office can see him

## 2013-04-18 ENCOUNTER — Telehealth: Payer: Self-pay | Admitting: *Deleted

## 2013-04-18 NOTE — Telephone Encounter (Signed)
Spoke with patient and apologized profusely about him not getting a call back this past Friday. He did tell me that he went to the ER Saturday morning and is being treated for scabies. Just an FYI. Thanks.

## 2013-04-18 NOTE — Telephone Encounter (Signed)
Noted.  That was in my differential, especially when no response to treatment for contact derm. Didn't look like scabies at last visit.  ER must have been in [place], no notes in system

## 2013-05-02 ENCOUNTER — Other Ambulatory Visit: Payer: Managed Care, Other (non HMO)

## 2013-06-19 IMAGING — CR DG SHOULDER 2+V*L*
3 series · 3 of 3 positions shown · non-contrast
Comparison: None.

CLINICAL DATA: Fall 10/25/2012.  Shoulder pain.

LEFT SHOULDER - 2+ VIEW

[view not recorded (1 of 3)]
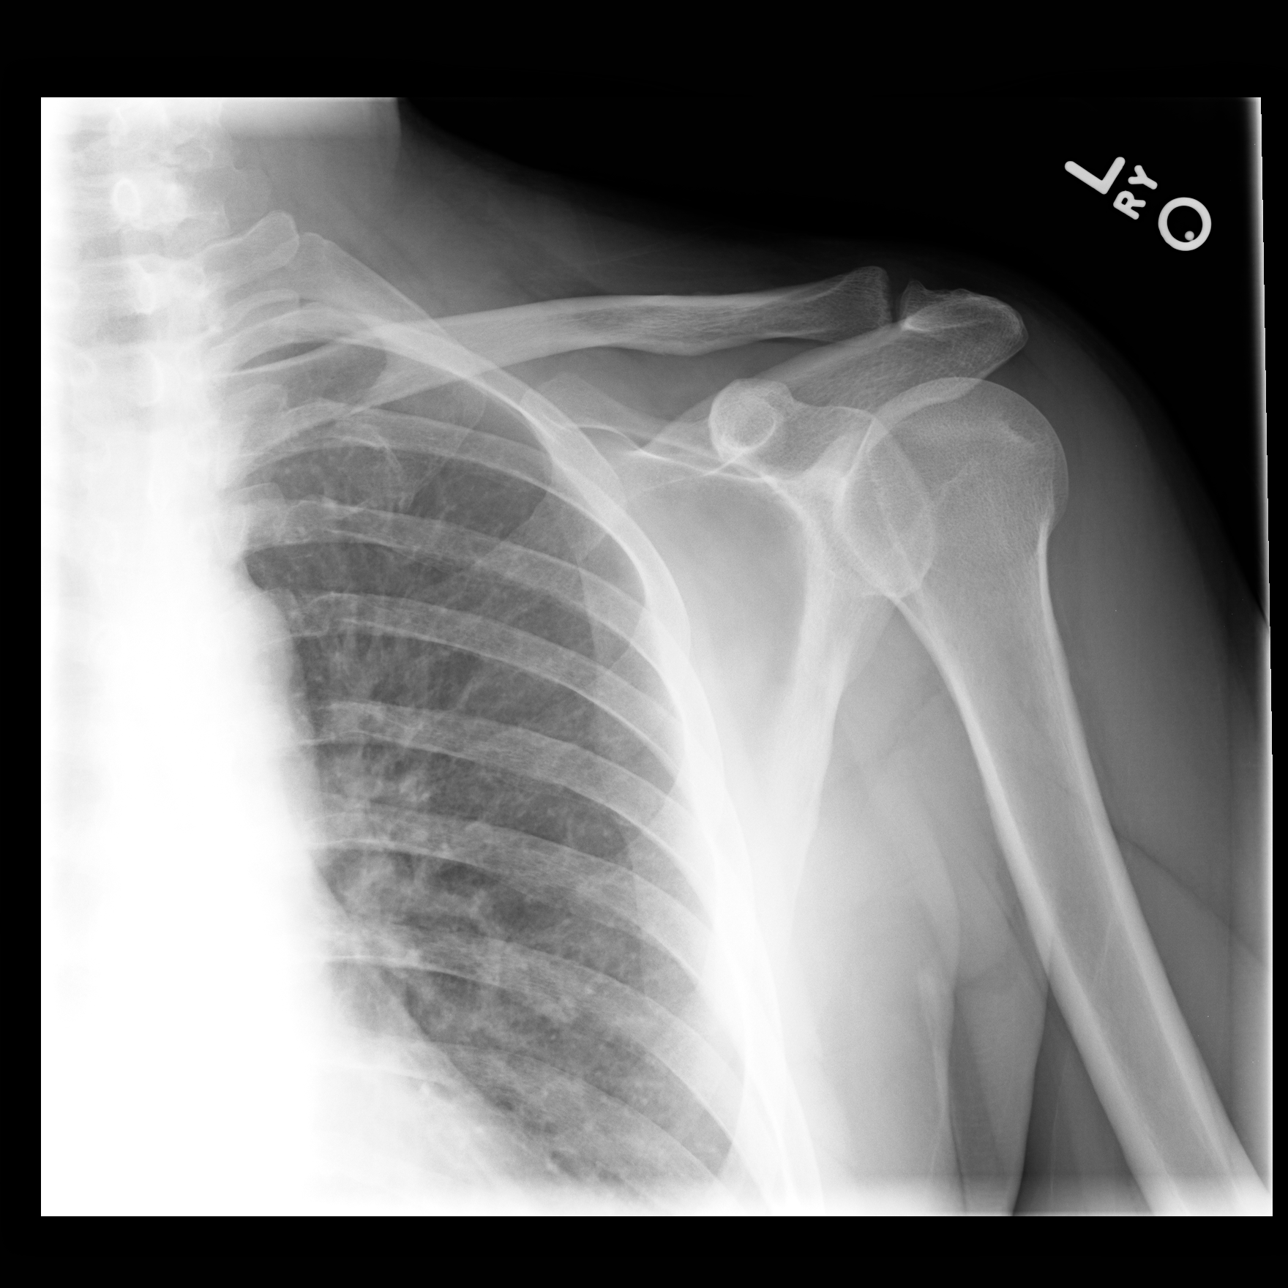

[view not recorded (2 of 3)]
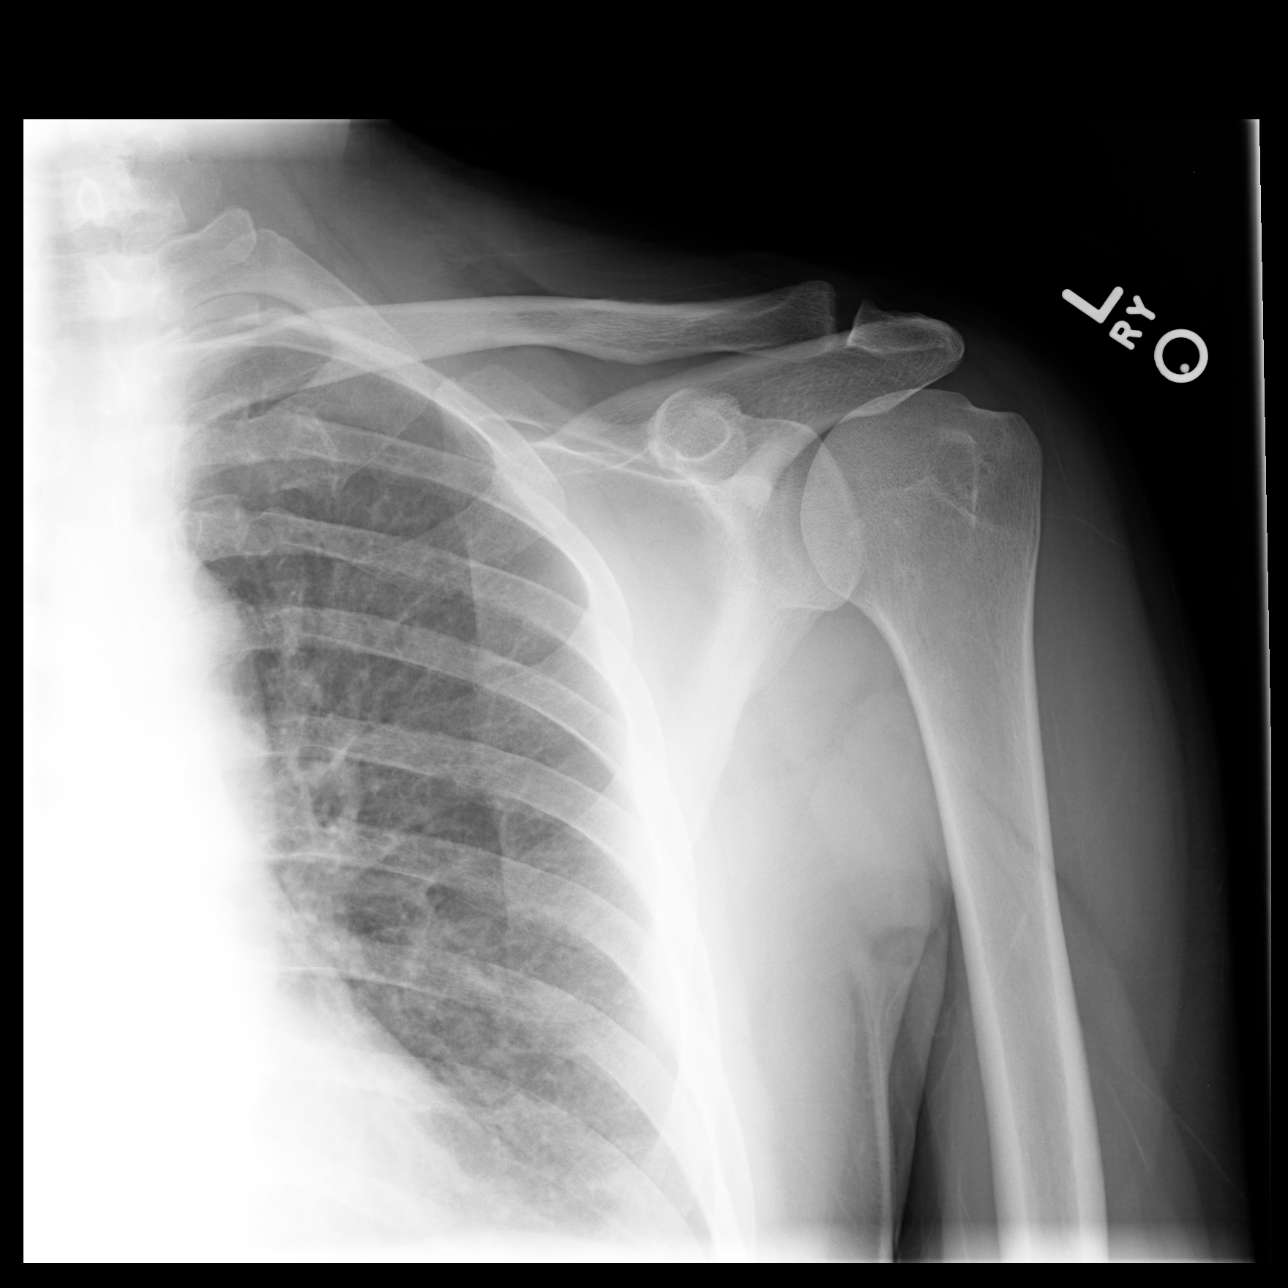

[view not recorded (3 of 3)]
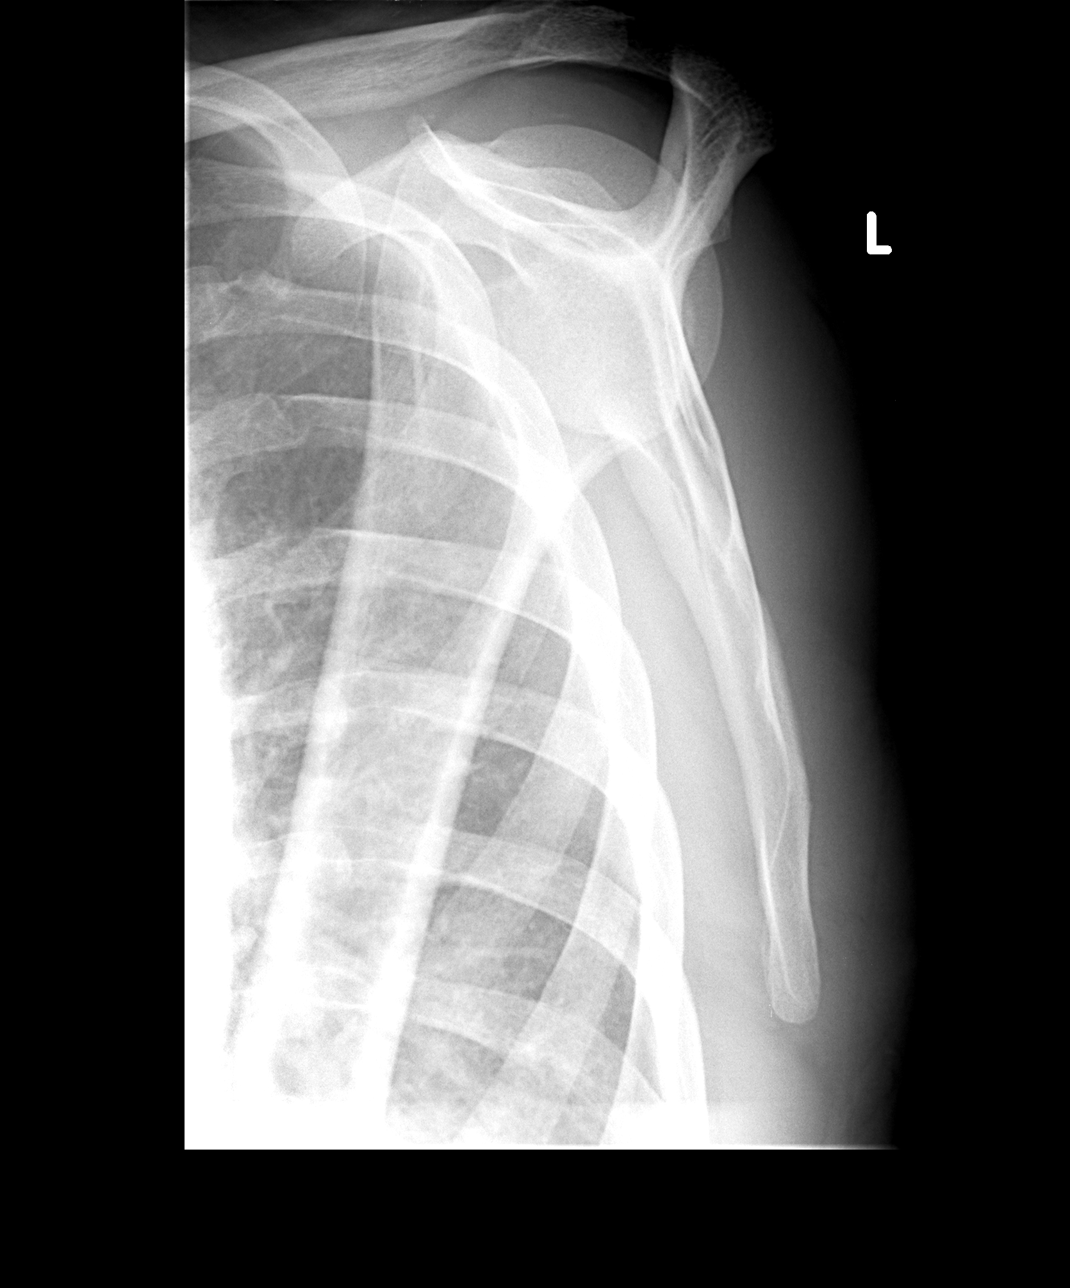

[3 of 3 positions shown; findings below may reference images not displayed]

FINDINGS: Mild acromioclavicular joint degenerative changes.  No
fracture or dislocation.  Visualized lungs are clear.
IMPRESSION: Mild acromioclavicular joint degenerative changes.

No fracture or dislocation.

## 2014-02-20 ENCOUNTER — Encounter: Payer: Self-pay | Admitting: *Deleted

## 2015-09-09 DIAGNOSIS — I214 Non-ST elevation (NSTEMI) myocardial infarction: Secondary | ICD-10-CM | POA: Insufficient documentation

## 2016-03-06 DIAGNOSIS — I2510 Atherosclerotic heart disease of native coronary artery without angina pectoris: Secondary | ICD-10-CM | POA: Insufficient documentation

## 2016-04-15 ENCOUNTER — Encounter: Payer: Self-pay | Admitting: Internal Medicine

## 2017-05-20 DIAGNOSIS — E785 Hyperlipidemia, unspecified: Secondary | ICD-10-CM | POA: Diagnosis not present

## 2017-05-20 DIAGNOSIS — D72829 Elevated white blood cell count, unspecified: Secondary | ICD-10-CM | POA: Diagnosis not present

## 2017-05-20 DIAGNOSIS — R209 Unspecified disturbances of skin sensation: Secondary | ICD-10-CM | POA: Diagnosis not present

## 2017-05-20 DIAGNOSIS — M79602 Pain in left arm: Secondary | ICD-10-CM | POA: Diagnosis not present

## 2017-05-20 DIAGNOSIS — Z79899 Other long term (current) drug therapy: Secondary | ICD-10-CM | POA: Diagnosis not present

## 2017-05-29 DIAGNOSIS — G40309 Generalized idiopathic epilepsy and epileptic syndromes, not intractable, without status epilepticus: Secondary | ICD-10-CM | POA: Diagnosis not present

## 2017-05-29 DIAGNOSIS — G4701 Insomnia due to medical condition: Secondary | ICD-10-CM | POA: Diagnosis not present

## 2017-06-03 DIAGNOSIS — M79602 Pain in left arm: Secondary | ICD-10-CM | POA: Diagnosis not present

## 2017-06-03 DIAGNOSIS — D72829 Elevated white blood cell count, unspecified: Secondary | ICD-10-CM | POA: Diagnosis not present

## 2017-06-03 DIAGNOSIS — I2510 Atherosclerotic heart disease of native coronary artery without angina pectoris: Secondary | ICD-10-CM | POA: Diagnosis not present

## 2017-06-03 DIAGNOSIS — E785 Hyperlipidemia, unspecified: Secondary | ICD-10-CM | POA: Diagnosis not present

## 2017-08-21 DIAGNOSIS — E785 Hyperlipidemia, unspecified: Secondary | ICD-10-CM | POA: Diagnosis not present

## 2017-08-21 DIAGNOSIS — Z79899 Other long term (current) drug therapy: Secondary | ICD-10-CM | POA: Diagnosis not present

## 2017-08-21 DIAGNOSIS — I2510 Atherosclerotic heart disease of native coronary artery without angina pectoris: Secondary | ICD-10-CM | POA: Diagnosis not present

## 2017-08-21 DIAGNOSIS — D72829 Elevated white blood cell count, unspecified: Secondary | ICD-10-CM | POA: Diagnosis not present

## 2017-08-24 DIAGNOSIS — M25561 Pain in right knee: Secondary | ICD-10-CM | POA: Diagnosis not present

## 2017-08-25 DIAGNOSIS — M25561 Pain in right knee: Secondary | ICD-10-CM | POA: Diagnosis not present

## 2017-08-27 DIAGNOSIS — Z01818 Encounter for other preprocedural examination: Secondary | ICD-10-CM | POA: Diagnosis not present

## 2017-08-27 DIAGNOSIS — S83241A Other tear of medial meniscus, current injury, right knee, initial encounter: Secondary | ICD-10-CM | POA: Diagnosis not present

## 2017-09-01 ENCOUNTER — Encounter: Payer: Self-pay | Admitting: Cardiology

## 2017-09-01 ENCOUNTER — Other Ambulatory Visit: Payer: Self-pay | Admitting: Cardiology

## 2017-09-01 ENCOUNTER — Ambulatory Visit (INDEPENDENT_AMBULATORY_CARE_PROVIDER_SITE_OTHER): Payer: BLUE CROSS/BLUE SHIELD | Admitting: Cardiology

## 2017-09-01 VITALS — BP 114/68 | HR 80 | Resp 12 | Ht 74.0 in | Wt 212.8 lb

## 2017-09-01 DIAGNOSIS — G40909 Epilepsy, unspecified, not intractable, without status epilepticus: Secondary | ICD-10-CM

## 2017-09-01 DIAGNOSIS — Z01818 Encounter for other preprocedural examination: Secondary | ICD-10-CM | POA: Diagnosis not present

## 2017-09-01 DIAGNOSIS — F17200 Nicotine dependence, unspecified, uncomplicated: Secondary | ICD-10-CM

## 2017-09-01 DIAGNOSIS — I2510 Atherosclerotic heart disease of native coronary artery without angina pectoris: Secondary | ICD-10-CM | POA: Diagnosis not present

## 2017-09-01 DIAGNOSIS — E784 Other hyperlipidemia: Secondary | ICD-10-CM | POA: Diagnosis not present

## 2017-09-01 DIAGNOSIS — E782 Mixed hyperlipidemia: Secondary | ICD-10-CM

## 2017-09-01 NOTE — Progress Notes (Signed)
Cardiology Office Note:    Date:  09/01/2017   ID:  Nicholas Nguyen, DOB 12-04-1961, MRN [id number]  PCP:  Rita Ohara, MD  Cardiologist:  Jenne Campus, MD    Referring MD: Jaymes Graff, DO   Chief Complaint  Patient presents with  . Pre-op Exam  pre-op evaluation  History of Present Illness:    Nicholas Nguyen is a 56 y.o. male  With past medical history significant for coronary artery disease. He comes for evaluation before knee surgery. He denies having any chest pain tightness squeezing pressure burning in the chest but before his myocardial infarction and he also did not have any symptoms. On top of that his ability to exercise is very limited because of his knee problem. Luckily he quit smoking and I congratulated him for it. And encourage him to stay from cigarettes.e does have dyslipidemia and only medication is on Zetia I will check his fasting lipid profile today. He clearly can benefit from high intensity statin.  Past Medical History:  Diagnosis Date  . Personal history of colonic adenoma 03/20/2013  . Seizure ([place])    first 2009, total of 5. last seizure 11/13  . Smoker     Past Surgical History:  Procedure Laterality Date  . APPENDECTOMY    . CARDIAC CATHETERIZATION    . MEDIAL COLLATERAL LIGAMENT REPAIR, KNEE  2012   LEFT  . TONSILLECTOMY AND ADENOIDECTOMY  age 66    Current Medications: Current Meds  Medication Sig  . aspirin 81 MG chewable tablet Chew 81 mg by mouth daily.  . diclofenac (VOLTAREN) 75 MG EC tablet Take 1 tablet by mouth 2 (two) times daily.  Marland Kitchen ezetimibe (ZETIA) 10 MG tablet Take 1 tablet by mouth daily.  Marland Kitchen levETIRAcetam (KEPPRA) 1000 MG tablet Take 3,000 mg by mouth daily.  . Multiple Vitamin (MULTI-VITAMINS) TABS Take 1 tablet by mouth daily.  . nitroGLYCERIN (NITROSTAT) 0.4 MG SL tablet Place 0.4 mg under the tongue as needed for chest pain.  Marland Kitchen zonisamide (ZONEGRAN) 50 MG capsule Take 150 mg by mouth daily.     Allergies:    Carbamazepine; Morphine; Penicillins; and Vicodin [hydrocodone-acetaminophen]   Social History   Social History  . Marital status: Single    Spouse name: N/A  . Number of children: N/A  . Years of education: N/A   Occupational History  . maintenance supervisor (heating/air/plumbing) for [employer]  . Smoking status: Current Every Day Smoker    Packs/day: 1.00    Types: Cigarettes  . Smokeless tobacco: Current User     Comment: smoking since age 18  . Alcohol use Yes     Comment: states he will go months without drinking, drank this Friday. sporadic  . Drug use: No  . Sexual activity: Yes    Partners: Female   Other Topics Concern  . Not on file   Social History Narrative   Lives with his [name].  3 kids, live in [place]     Family History: The patient's family history includes Cancer in his mother and sister; Diabetes in his mother; Heart disease in his brother and paternal aunt; Seizures (age of onset: 65) in his daughter; Stroke in his mother; Stroke (age of onset: 25) in his father. There is no history of Colon cancer. ROS:   Please see the history of present illness.    All 14 point review of systems negative except as  described per history of present illness  EKGs/Labs/Other Studies Reviewed:      Recent Labs: No results found for requested labs within last 8760 hours.  Recent Lipid Panel    Component Value Date/Time   CHOL 289 (H) 01/24/2013 1543   TRIG 442 (H) 01/24/2013 1543   HDL 34 (L) 01/24/2013 1543   CHOLHDL 8.5 01/24/2013 1543   VLDL NOT CALC 01/24/2013 1543   LDLCALC  01/24/2013 1543     Comment:       Not calculated due to Triglyceride >400. Suggest ordering Direct LDL (Unit Code: [id number]).   Total Cholesterol/HDL Ratio:CHD Risk                        Coronary Heart Disease Risk Table                                        Men       Women          1/2 Average Risk               3.4        3.3              Average Risk              5.0        4.4           2X Average Risk              9.6        7.1           3X Average Risk             23.4       11.0 Use the calculated Patient Ratio above and the CHD Risk table  to determine the patient's CHD Risk. ATP III Classification (LDL):       < 100        mg/dL         Optimal      100 - 129     mg/dL         Near or Above Optimal      130 - 159     mg/dL         Borderline High      160 - 189     mg/dL         High       > 190        mg/dL         Very High      Physical Exam:    VS:  Ht 6\' 2"  (1.88 m)   Wt 212 lb 12.8 oz (96.5 kg)   BMI 27.32 kg/m     Wt Readings from Last 3 Encounters:  09/01/17 212 lb 12.8 oz (96.5 kg)  04/04/13 219 lb (99.3 kg)  03/14/13 223 lb (101.2 kg)     GEN:  Well nourished, well developed in no acute distress HEENT: Normal NECK: No JVD; No carotid bruits LYMPHATICS: No lymphadenopathy CARDIAC: RRR, no murmurs, no rubs, no gallops RESPIRATORY:  Clear to auscultation without rales, wheezing or rhonchi  ABDOMEN: Soft, non-tender, non-distended MUSCULOSKELETAL:  No edema; No deformity  SKIN: Warm and dry LOWER EXTREMITIES: no swelling NEUROLOGIC:  Alert and oriented x 3 PSYCHIATRIC:  Normal affect  ASSESSMENT:    1. Coronary artery disease involving native coronary artery of native heart without angina pectoris   2. Seizure disorder ([place])   3. Multiple-type hyperlipidemia   4. Tobacco use disorder    PLAN:    In order of problems listed above:  1. Coronary artery disease: Asymptomatic but not able to exercise because of recent knee injury. I will ask him to have a stress test it will be done in form of [place]. 2. Seizure disorders: denies having any 3. Hyperlipidemia:we'll check fasting lipid profile. 4. Tobacco use:He stopped smoking many months ago stays away from smoking. I encouraged him to not to smoke.   Medication Adjustments/Labs and Tests  Ordered: Current medicines are reviewed at length with the patient today.  Concerns regarding medicines are outlined above.  No orders of the defined types were placed in this encounter.  Medication changes: No orders of the defined types were placed in this encounter.   Signed, Park Liter, MD, [facility] 09/01/2017 10:42 AM    [place]

## 2017-09-01 NOTE — Patient Instructions (Addendum)
Medication Instructions:  Your physician recommends that you continue on your current medications as directed. Please refer to the Current Medication list given to you today.  Labwork: Your physician recommends that you return for a FASTING lipid profile: Today in office Testing/Procedures:  Stress Test Directions for [facility]: 1.) Please check in at the outpatient center at [facility] the day of your testing. 2.) Nothing to eat or drink after midnight prior to testing.  3.) Please be aware that the test can take up to 3-4 hours.  4.) Should you have any problem with the appointment date or time, please call [phone].   Regadenoson injection What is this medicine? REGADENOSON is used to test the heart for coronary artery disease. It is used in patients who can not exercise for their stress test. This medicine may be used for other purposes; ask your health care provider or pharmacist if you have questions. COMMON BRAND NAME(S): Lexiscan What should I tell my health care provider before I take this medicine? They need to know if you have any of these conditions: -heart problems -lung or breathing disease, like asthma or COPD -an unusual or allergic reaction to regadenoson, other medicines, foods, dyes, or preservatives -pregnant or trying to get pregnant -breast-feeding How should I use this medicine? This medicine is for injection into a vein. It is given by a health care professional in a hospital or clinic setting. Talk to your pediatrician regarding the use of this medicine in children. Special care may be needed. Overdosage: If you think you have taken too much of this medicine contact a poison control center or emergency room at once. NOTE: This medicine is only for you. Do not share this medicine with others. What if I miss a dose? This does not apply. What may interact with this medicine? -caffeine -dipyridamole -guarana -theophylline This list may not  describe all possible interactions. Give your health care provider a list of all the medicines, herbs, non-prescription drugs, or dietary supplements you use. Also tell them if you smoke, drink alcohol, or use illegal drugs. Some items may interact with your medicine. What should I watch for while using this medicine? Your condition will be monitored carefully while you are receiving this medicine. Do not take medicines, foods, or drinks with caffeine (like coffee, tea, or colas) for at least 12 hours before your test. If you do not know if something contains caffeine, ask your health care professional. What side effects may I notice from receiving this medicine? Side effects that you should report to your doctor or health care professional as soon as possible: -allergic reactions like skin rash, itching or hives, swelling of the face, lips, or tongue -breathing problems -chest pain, tightness or palpitations -severe headache Side effects that usually do not require medical attention (report to your doctor or health care professional if they continue or are bothersome): -flushing -headache -irritation or pain at site where injected -nausea, vomiting This list may not describe all possible side effects. Call your doctor for medical advice about side effects. You may report side effects to FDA at 1-800-FDA-1088. Where should I keep my medicine? This drug is given in a hospital or clinic and will not be stored at home. NOTE: This sheet is a summary. It may not cover all possible information. If you have questions about this medicine, talk to your doctor, pharmacist, or health care provider.  2018 Elsevier/Gold Standard (2008-07-31 15:08:13)   Follow-Up: Your physician recommends that you schedule a  follow-up appointment in: 1 month   Any Other Special Instructions Will Be Listed Below (If Applicable).  Please note that any paperwork needing to be filled out by the provider will need to be  addressed at the front desk prior to seeing the provider. Please note that any paperwork FMLA, Disability or other documents regarding health condition is subject to a $25.00 charge that must be received prior to completion of paperwork in the form of a money order or check.     If you need a refill on your cardiac medications before your next appointment, please call your pharmacy.

## 2017-09-02 LAB — LIPID PANEL
CHOL/HDL RATIO: 6.5 ratio — AB (ref 0.0–5.0)
CHOLESTEROL TOTAL: 247 mg/dL — AB (ref 100–199)
HDL: 38 mg/dL — ABNORMAL LOW (ref >39)
LDL CALC: 158 mg/dL — AB (ref 0–99)
Triglycerides: 256 mg/dL — ABNORMAL HIGH (ref 0–149)
VLDL Cholesterol Cal: 51 mg/dL — ABNORMAL HIGH (ref 5–40)

## 2017-09-03 ENCOUNTER — Telehealth: Payer: Self-pay

## 2017-09-03 DIAGNOSIS — I2510 Atherosclerotic heart disease of native coronary artery without angina pectoris: Secondary | ICD-10-CM | POA: Diagnosis not present

## 2017-09-03 DIAGNOSIS — R079 Chest pain, unspecified: Secondary | ICD-10-CM | POA: Diagnosis not present

## 2017-09-03 MED ORDER — ROSUVASTATIN CALCIUM 20 MG PO TABS: 20.0000 mg | ORAL_TABLET | Freq: Every day | ORAL | 11 refills | Status: DC

## 2017-09-03 NOTE — Telephone Encounter (Signed)
-----   Message from Park Liter, MD sent at 09/03/2017  2:58 PM EDT ----- Chol elevated, start crestor 20 mg po qd

## 2017-09-03 NOTE — Telephone Encounter (Signed)
Pt advised of results and advised he is good to go for surgery. Pt educated regarding starting the Crestor. Pt verbalized understanding.

## 2017-09-04 DIAGNOSIS — M25561 Pain in right knee: Secondary | ICD-10-CM | POA: Diagnosis not present

## 2017-09-04 DIAGNOSIS — I10 Essential (primary) hypertension: Secondary | ICD-10-CM | POA: Diagnosis not present

## 2017-09-09 DIAGNOSIS — I2510 Atherosclerotic heart disease of native coronary artery without angina pectoris: Secondary | ICD-10-CM | POA: Diagnosis not present

## 2017-09-09 DIAGNOSIS — E785 Hyperlipidemia, unspecified: Secondary | ICD-10-CM | POA: Diagnosis not present

## 2017-09-09 DIAGNOSIS — F17210 Nicotine dependence, cigarettes, uncomplicated: Secondary | ICD-10-CM | POA: Diagnosis not present

## 2017-09-09 DIAGNOSIS — I252 Old myocardial infarction: Secondary | ICD-10-CM | POA: Diagnosis not present

## 2017-09-09 DIAGNOSIS — I10 Essential (primary) hypertension: Secondary | ICD-10-CM | POA: Diagnosis not present

## 2017-09-09 DIAGNOSIS — Z8669 Personal history of other diseases of the nervous system and sense organs: Secondary | ICD-10-CM | POA: Diagnosis not present

## 2017-09-09 DIAGNOSIS — X58XXXA Exposure to other specified factors, initial encounter: Secondary | ICD-10-CM | POA: Diagnosis not present

## 2017-09-09 DIAGNOSIS — S83241A Other tear of medial meniscus, current injury, right knee, initial encounter: Secondary | ICD-10-CM | POA: Diagnosis not present

## 2017-09-09 DIAGNOSIS — J449 Chronic obstructive pulmonary disease, unspecified: Secondary | ICD-10-CM | POA: Diagnosis not present

## 2017-09-09 DIAGNOSIS — M6751 Plica syndrome, right knee: Secondary | ICD-10-CM | POA: Diagnosis not present

## 2017-09-09 DIAGNOSIS — G8929 Other chronic pain: Secondary | ICD-10-CM | POA: Diagnosis not present

## 2017-10-05 ENCOUNTER — Encounter: Payer: Self-pay | Admitting: Cardiology

## 2017-10-05 ENCOUNTER — Ambulatory Visit (INDEPENDENT_AMBULATORY_CARE_PROVIDER_SITE_OTHER): Payer: BLUE CROSS/BLUE SHIELD | Admitting: Cardiology

## 2017-10-05 VITALS — BP 130/82 | HR 72 | Resp 14 | Ht 74.0 in | Wt 214.8 lb

## 2017-10-05 DIAGNOSIS — I2510 Atherosclerotic heart disease of native coronary artery without angina pectoris: Secondary | ICD-10-CM | POA: Diagnosis not present

## 2017-10-05 DIAGNOSIS — F17200 Nicotine dependence, unspecified, uncomplicated: Secondary | ICD-10-CM | POA: Diagnosis not present

## 2017-10-05 DIAGNOSIS — E782 Mixed hyperlipidemia: Secondary | ICD-10-CM | POA: Diagnosis not present

## 2017-10-05 MED ORDER — ROSUVASTATIN CALCIUM 20 MG PO TABS: 20.0000 mg | ORAL_TABLET | Freq: Every day | ORAL | 6 refills | Status: DC

## 2017-10-05 NOTE — Progress Notes (Signed)
Cardiology Office Note:    Date:  10/05/2017   ID:  Nicholas Nguyen, DOB [birth year], MRN [id number]  PCP:  Rita Ohara, MD  Cardiologist:  Jenne Campus, MD    Referring MD: Rita Ohara, MD   Chief Complaint  Patient presents with  . 1 month follow up  I am doing well, I had knee surgery  History of Present Illness:    Nicholas Nguyen is a 56 y.o. male  with coronary artery disease. Recently he required knee surgery. Before that he had stress test which showed no evidence of ischemia. He is asymptomatic from cardiac standpoint of view denies having any chest pain tightness squeezing pressure burning in the chest, started exercising but still because of recent knee surgery that's difficult. Still abstain from smoking and I congratulated him for it. Does have ups of the unacceptable cholesterol. Did not have adjusted the Crestor. I stressed importance of taking that medication. She is willing to try Crestor 20. We'll recheck his fasting lipid profile about 6 weeks later.  Past Medical History:  Diagnosis Date  . Personal history of colonic adenoma 03/20/2013  . Seizure ([place])    first 2009, total of 5. last seizure 11/13  . Smoker     Past Surgical History:  Procedure Laterality Date  . APPENDECTOMY    . CARDIAC CATHETERIZATION    . MEDIAL COLLATERAL LIGAMENT REPAIR, KNEE  2012   LEFT  . TONSILLECTOMY AND ADENOIDECTOMY  age 10    Current Medications: Current Meds  Medication Sig  . aspirin 81 MG chewable tablet Chew 81 mg by mouth daily.  Marland Kitchen ezetimibe (ZETIA) 10 MG tablet Take 1 tablet by mouth daily.  Marland Kitchen levETIRAcetam (KEPPRA) 1000 MG tablet Take 3,000 mg by mouth daily.  . Multiple Vitamin (MULTI-VITAMINS) TABS Take 1 tablet by mouth daily.  . nitroGLYCERIN (NITROSTAT) 0.4 MG SL tablet Place 0.4 mg under the tongue as needed for chest pain.  Marland Kitchen zonisamide (ZONEGRAN) 50 MG capsule Take 150 mg by mouth daily.     Allergies:   Carbamazepine; Morphine; Penicillins; and  Vicodin [hydrocodone-acetaminophen]   Social History   Social History  . Marital status: Single    Spouse name: N/A  . Number of children: N/A  . Years of education: N/A   Occupational History  . maintenance supervisor (heating/air/plumbing) for [employer]  . Smoking status: Former Smoker    Packs/day: 1.00    Types: Cigarettes  . Smokeless tobacco: Never Used     Comment: smoking since age 58  . Alcohol use Yes     Comment: states he will go months without drinking, drank this Friday. sporadic  . Drug use: No  . Sexual activity: Yes    Partners: Female   Other Topics Concern  . None   Social History Narrative   Lives with his [name].  3 kids, live in [place]     Family History: The patient's family history includes Cancer in his mother and sister; Diabetes in his mother; Heart disease in his brother and paternal aunt; Seizures (age of onset: 81) in his daughter; Stroke in his mother; Stroke (age of onset: 10) in his father. There is no history of Colon cancer. ROS:   Please see the history of present illness.    All 14 point review of systems negative except as described per history of present illness  EKGs/Labs/Other Studies Reviewed:  Recent Labs: No results found for requested labs within last 8760 hours.  Recent Lipid Panel    Component Value Date/Time   CHOL 247 (H) 09/01/2017 0000   TRIG 256 (H) 09/01/2017 0000   HDL 38 (L) 09/01/2017 0000   CHOLHDL 6.5 (H) 09/01/2017 0000   CHOLHDL 8.5 01/24/2013 1543   VLDL NOT CALC 01/24/2013 1543   LDLCALC 158 (H) 09/01/2017 0000    Physical Exam:    VS:  BP 130/82   Pulse 72   Resp 14   Ht 6\' 2"  (1.88 m)   Wt 214 lb 12.8 oz (97.4 kg)   BMI 27.58 kg/m     Wt Readings from Last 3 Encounters:  10/05/17 214 lb 12.8 oz (97.4 kg)  09/01/17 212 lb 12.8 oz (96.5 kg)  04/04/13 219 lb (99.3 kg)     GEN:  Well nourished, well  developed in no acute distress HEENT: Normal NECK: No JVD; No carotid bruits LYMPHATICS: No lymphadenopathy CARDIAC: RRR, no murmurs, no rubs, no gallops RESPIRATORY:  Clear to auscultation without rales, wheezing or rhonchi  ABDOMEN: Soft, non-tender, non-distended MUSCULOSKELETAL:  No edema; No deformity  SKIN: Warm and dry LOWER EXTREMITIES: no swelling NEUROLOGIC:  Alert and oriented x 3 PSYCHIATRIC:  Normal affect   ASSESSMENT:    1. Coronary artery disease involving native coronary artery of native heart without angina pectoris   2. Multiple-type hyperlipidemia   3. Tobacco use disorder    PLAN:    In order of problems listed above:  1. Coronary artery disease: Stable: Recent stress to show no evidence of ischemia. 2. Dyslipidemia: We'll restart Crestor. 3. Tobacco still abstain from smoking. I encouraged him not to smoke. He is doing well from that point of view. 4. I encouraged him exercise and regular basis: We'll check his fasting lipid profile in about 6 weeks from today.   Medication Adjustments/Labs and Tests Ordered: Current medicines are reviewed at length with the patient today.  Concerns regarding medicines are outlined above.  No orders of the defined types were placed in this encounter.  Medication changes: No orders of the defined types were placed in this encounter.   Signed, Park Liter, MD, [facility] 10/05/2017 8:39 AM    [place]

## 2017-10-05 NOTE — Patient Instructions (Addendum)
Medication Instructions:  Your physician has recommended you make the following change in your medication: 1.) RESTART Crestor 20 mg daily.   1. Avoid all over-the-counter antihistamines except Claritin/Loratadine and Zyrtec/Cetrizine. 2. Avoid all combination including cold sinus allergies flu decongestant and sleep medications 3. You can use Robitussin DM Mucinex and Mucinex DM for cough. 4. can use Tylenol aspirin ibuprofen and naproxen but no combinations such as sleep or sinus.  Labwork: Your physician recommends that you return for a FASTING lipid profile: 6 weeks.   Testing/Procedures: None   Follow-Up: Your physician wants you to follow-up in: 5 months. You will receive a reminder letter in the mail two months in advance. If you don't receive a letter, please call our office to schedule the follow-up appointment.  Any Other Special Instructions Will Be Listed Below (If Applicable).  Please note that any paperwork needing to be filled out by the provider will need to be addressed at the front desk prior to seeing the provider. Please note that any paperwork FMLA, Disability or other documents regarding health condition is subject to a $25.00 charge that must be received prior to completion of paperwork in the form of a money order or check.     If you need a refill on your cardiac medications before your next appointment, please call your pharmacy.

## 2017-10-26 DIAGNOSIS — M6281 Muscle weakness (generalized): Secondary | ICD-10-CM | POA: Diagnosis not present

## 2017-10-26 DIAGNOSIS — R2689 Other abnormalities of gait and mobility: Secondary | ICD-10-CM | POA: Diagnosis not present

## 2017-10-26 DIAGNOSIS — S83241D Other tear of medial meniscus, current injury, right knee, subsequent encounter: Secondary | ICD-10-CM | POA: Diagnosis not present

## 2017-10-26 DIAGNOSIS — M25561 Pain in right knee: Secondary | ICD-10-CM | POA: Diagnosis not present

## 2017-10-29 DIAGNOSIS — M6281 Muscle weakness (generalized): Secondary | ICD-10-CM | POA: Diagnosis not present

## 2017-10-29 DIAGNOSIS — Z7982 Long term (current) use of aspirin: Secondary | ICD-10-CM | POA: Diagnosis not present

## 2017-10-29 DIAGNOSIS — S42001A Fracture of unspecified part of right clavicle, initial encounter for closed fracture: Secondary | ICD-10-CM | POA: Diagnosis not present

## 2017-10-29 DIAGNOSIS — S83241D Other tear of medial meniscus, current injury, right knee, subsequent encounter: Secondary | ICD-10-CM | POA: Diagnosis not present

## 2017-10-29 DIAGNOSIS — J449 Chronic obstructive pulmonary disease, unspecified: Secondary | ICD-10-CM | POA: Diagnosis not present

## 2017-10-29 DIAGNOSIS — M25511 Pain in right shoulder: Secondary | ICD-10-CM | POA: Diagnosis not present

## 2017-10-29 DIAGNOSIS — Z79899 Other long term (current) drug therapy: Secondary | ICD-10-CM | POA: Diagnosis not present

## 2017-10-29 DIAGNOSIS — F17210 Nicotine dependence, cigarettes, uncomplicated: Secondary | ICD-10-CM | POA: Diagnosis not present

## 2017-10-29 DIAGNOSIS — M25561 Pain in right knee: Secondary | ICD-10-CM | POA: Diagnosis not present

## 2017-10-29 DIAGNOSIS — S42021A Displaced fracture of shaft of right clavicle, initial encounter for closed fracture: Secondary | ICD-10-CM | POA: Diagnosis not present

## 2017-10-29 DIAGNOSIS — R2689 Other abnormalities of gait and mobility: Secondary | ICD-10-CM | POA: Diagnosis not present

## 2017-10-29 DIAGNOSIS — W010XXA Fall on same level from slipping, tripping and stumbling without subsequent striking against object, initial encounter: Secondary | ICD-10-CM | POA: Diagnosis not present

## 2017-10-29 DIAGNOSIS — I10 Essential (primary) hypertension: Secondary | ICD-10-CM | POA: Diagnosis not present

## 2017-10-30 DIAGNOSIS — G40209 Localization-related (focal) (partial) symptomatic epilepsy and epileptic syndromes with complex partial seizures, not intractable, without status epilepticus: Secondary | ICD-10-CM | POA: Diagnosis not present

## 2017-10-30 DIAGNOSIS — G40309 Generalized idiopathic epilepsy and epileptic syndromes, not intractable, without status epilepticus: Secondary | ICD-10-CM | POA: Diagnosis not present

## 2017-10-30 DIAGNOSIS — M75101 Unspecified rotator cuff tear or rupture of right shoulder, not specified as traumatic: Secondary | ICD-10-CM | POA: Diagnosis not present

## 2017-11-04 DIAGNOSIS — S83241D Other tear of medial meniscus, current injury, right knee, subsequent encounter: Secondary | ICD-10-CM | POA: Diagnosis not present

## 2017-11-04 DIAGNOSIS — M25561 Pain in right knee: Secondary | ICD-10-CM | POA: Diagnosis not present

## 2017-11-04 DIAGNOSIS — M6281 Muscle weakness (generalized): Secondary | ICD-10-CM | POA: Diagnosis not present

## 2017-11-04 DIAGNOSIS — R2689 Other abnormalities of gait and mobility: Secondary | ICD-10-CM | POA: Diagnosis not present

## 2017-11-09 DIAGNOSIS — S42021A Displaced fracture of shaft of right clavicle, initial encounter for closed fracture: Secondary | ICD-10-CM | POA: Diagnosis not present

## 2017-11-11 DIAGNOSIS — M6281 Muscle weakness (generalized): Secondary | ICD-10-CM | POA: Diagnosis not present

## 2017-11-11 DIAGNOSIS — S83241D Other tear of medial meniscus, current injury, right knee, subsequent encounter: Secondary | ICD-10-CM | POA: Diagnosis not present

## 2017-11-11 DIAGNOSIS — R2689 Other abnormalities of gait and mobility: Secondary | ICD-10-CM | POA: Diagnosis not present

## 2017-11-11 DIAGNOSIS — M25561 Pain in right knee: Secondary | ICD-10-CM | POA: Diagnosis not present

## 2017-11-16 DIAGNOSIS — M6281 Muscle weakness (generalized): Secondary | ICD-10-CM | POA: Diagnosis not present

## 2017-11-16 DIAGNOSIS — M25561 Pain in right knee: Secondary | ICD-10-CM | POA: Diagnosis not present

## 2017-11-16 DIAGNOSIS — R2689 Other abnormalities of gait and mobility: Secondary | ICD-10-CM | POA: Diagnosis not present

## 2017-11-16 DIAGNOSIS — S83241D Other tear of medial meniscus, current injury, right knee, subsequent encounter: Secondary | ICD-10-CM | POA: Diagnosis not present

## 2017-11-24 DIAGNOSIS — S42021D Displaced fracture of shaft of right clavicle, subsequent encounter for fracture with routine healing: Secondary | ICD-10-CM | POA: Diagnosis not present

## 2017-12-22 DIAGNOSIS — J40 Bronchitis, not specified as acute or chronic: Secondary | ICD-10-CM | POA: Diagnosis not present

## 2017-12-22 DIAGNOSIS — J019 Acute sinusitis, unspecified: Secondary | ICD-10-CM | POA: Diagnosis not present

## 2017-12-23 DIAGNOSIS — S42021D Displaced fracture of shaft of right clavicle, subsequent encounter for fracture with routine healing: Secondary | ICD-10-CM | POA: Diagnosis not present

## 2018-01-06 DIAGNOSIS — S42021D Displaced fracture of shaft of right clavicle, subsequent encounter for fracture with routine healing: Secondary | ICD-10-CM | POA: Diagnosis not present

## 2018-02-04 MED FILL — ROSUVASTATIN CALCIUM 20 MG: 20 | 90 days supply | Qty: 90 | Fill #0

## 2018-02-16 DIAGNOSIS — J029 Acute pharyngitis, unspecified: Secondary | ICD-10-CM | POA: Diagnosis not present

## 2018-02-16 DIAGNOSIS — R509 Fever, unspecified: Secondary | ICD-10-CM | POA: Diagnosis not present

## 2018-02-16 DIAGNOSIS — R05 Cough: Secondary | ICD-10-CM | POA: Diagnosis not present

## 2018-03-04 MED FILL — LEVETIRACETAM ER 750 MG TAB: 750 | 30 days supply | Qty: 120 | Fill #0 | Status: TO

## 2018-03-04 MED FILL — ZONISAMIDE 50 MG CAPSULE: 50 | 90 days supply | Qty: 270 | Fill #0 | Status: TO

## 2018-03-10 ENCOUNTER — Encounter: Payer: Self-pay | Admitting: Cardiology

## 2018-03-10 ENCOUNTER — Ambulatory Visit (INDEPENDENT_AMBULATORY_CARE_PROVIDER_SITE_OTHER): Payer: 59 | Admitting: Cardiology

## 2018-03-10 VITALS — BP 140/70 | HR 94 | Ht 74.0 in | Wt 218.4 lb

## 2018-03-10 DIAGNOSIS — G40909 Epilepsy, unspecified, not intractable, without status epilepticus: Secondary | ICD-10-CM

## 2018-03-10 DIAGNOSIS — E785 Hyperlipidemia, unspecified: Secondary | ICD-10-CM

## 2018-03-10 DIAGNOSIS — I2510 Atherosclerotic heart disease of native coronary artery without angina pectoris: Secondary | ICD-10-CM

## 2018-03-10 DIAGNOSIS — F17200 Nicotine dependence, unspecified, uncomplicated: Secondary | ICD-10-CM | POA: Diagnosis not present

## 2018-03-10 NOTE — Progress Notes (Signed)
Cardiology Office Note:    Date:  03/10/2018   ID:  Nicholas Nguyen, DOB 04/11/1961, MRN [id number]  PCP:  Nicholas Ohara, MD  Cardiologist:  Nicholas Campus, MD    Referring MD: Nicholas Ohara, MD   Chief Complaint  Patient presents with  . Follow-up  Doing well  History of Present Illness:    Nicholas Nguyen is a 57 y.o. male with coronary artery disease status post PTCA and drug-eluting stent to obtuse marginal branch in September 2016.  He used to be a chronic smoker but stopped smoking a year ago still abstain from smoking.  Denies have any chest pain tightness squeezing pressure burning chest overall seems to be doing well.  Past Medical History:  Diagnosis Date  . Personal history of colonic adenoma 03/20/2013  . Seizure ([place])    first 2009, total of 5. last seizure 11/13  . Smoker     Past Surgical History:  Procedure Laterality Date  . APPENDECTOMY    . CARDIAC CATHETERIZATION    . MEDIAL COLLATERAL LIGAMENT REPAIR, KNEE  2012   LEFT  . TONSILLECTOMY AND ADENOIDECTOMY  age 50    Current Medications: Current Meds  Medication Sig  . aspirin 81 MG chewable tablet Chew 81 mg by mouth daily.  Marland Kitchen ezetimibe (ZETIA) 10 MG tablet Take 1 tablet by mouth daily.  Marland Kitchen levETIRAcetam (KEPPRA) 1000 MG tablet Take 3,000 mg by mouth daily.  . Multiple Vitamin (MULTI-VITAMINS) TABS Take 1 tablet by mouth daily.  . nitroGLYCERIN (NITROSTAT) 0.4 MG SL tablet Place 0.4 mg under the tongue as needed for chest pain.  . rosuvastatin (CRESTOR) 20 MG tablet Take 1 tablet (20 mg total) by mouth daily.  Marland Kitchen zonisamide (ZONEGRAN) 50 MG capsule Take 150 mg by mouth daily.     Allergies:   Carbamazepine; Morphine; Penicillins; and Vicodin [hydrocodone-acetaminophen]   Social History   Socioeconomic History  . Marital status: Single    Spouse name: Not on file  . Number of children: Not on file  . Years of education: Not on file  . Highest education level: Not on file  Occupational History  .  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 Financial resource strain: Not on file  . Food insecurity:    Worry: Not on file    Inability: Not on file  . Transportation needs:    Medical: Not on file    Non-medical: Not on file  Tobacco Use  . Smoking status: Former Smoker    Packs/day: 1.00    Types: Cigarettes  . Smokeless tobacco: Never Used  . Tobacco comment: smoking since age 89  Substance and Sexual Activity  . Alcohol use: Yes    Comment: states he will go months without drinking, drank this Friday. sporadic  . Drug use: No  . Sexual activity: Yes    Partners: Female  Lifestyle  . Physical activity:    Days per week: Not on file    Minutes per session: Not on file  . Stress: Not on file  Relationships  . Social connections:    Talks on phone: Not on file    Gets together: Not on file    Attends religious service: Not on file    Active member of club or organization: Not on file    Attends meetings of clubs or organizations: Not on file    Relationship status: Not on file  Other Topics Concern  . Not on file  Social History Narrative   Lives with his Nicholas Nguyen.  3 kids, live in [place]     Family History: The patient's family history includes Cancer in his mother and sister; Diabetes in his mother; Heart disease in his brother and paternal aunt; Seizures (age of onset: 52) in his daughter; Stroke in his mother; Stroke (age of onset: 93) in his father. There is no history of Colon cancer. ROS:   Please see the history of present illness.    All 14 point review of systems negative except as described per history of present illness  EKGs/Labs/Other Studies Reviewed:      Recent Labs: No results found for requested labs within last 8760 hours.  Recent Lipid Panel    Component Value Date/Time   CHOL 247 (H) 09/01/2017 0000   TRIG 256 (H) 09/01/2017 0000    HDL 38 (L) 09/01/2017 0000   CHOLHDL 6.5 (H) 09/01/2017 0000   CHOLHDL 8.5 01/24/2013 1543   VLDL NOT CALC 01/24/2013 1543   LDLCALC 158 (H) 09/01/2017 0000    Physical Exam:    VS:  BP 140/70   Pulse 94   Ht 6\' 2"  (1.88 m)   Wt 218 lb 6.4 oz (99.1 kg)   SpO2 97%   BMI 28.04 kg/m     Wt Readings from Last 3 Encounters:  03/10/18 218 lb 6.4 oz (99.1 kg)  10/05/17 214 lb 12.8 oz (97.4 kg)  09/01/17 212 lb 12.8 oz (96.5 kg)     GEN:  Well nourished, well developed in no acute distress HEENT: Normal NECK: No JVD; No carotid bruits LYMPHATICS: No lymphadenopathy CARDIAC: RRR, no murmurs, no rubs, no gallops RESPIRATORY:  Clear to auscultation without rales, wheezing or rhonchi  ABDOMEN: Soft, non-tender, non-distended MUSCULOSKELETAL:  No edema; No deformity  SKIN: Warm and dry LOWER EXTREMITIES: no swelling NEUROLOGIC:  Alert and oriented x 3 PSYCHIATRIC:  Normal affect   ASSESSMENT:    1. Coronary artery disease involving native coronary artery of native heart without angina pectoris   2. Tobacco use disorder   3. Seizure disorder ([place])   4. Dyslipidemia    PLAN:    In order of problems listed above:  1. Coronary artery disease: Stable and appropriate medications which I will continue. 2. History of tobacco abuse: He stopped 1 year ago we discussed technique how to avoid and manage craving for cigarettes. 3. Seizure disorder: Denies having any. 4. Dyslipidemia: Will check his fasting lipid profile today.  See him back in 6 months   Medication Adjustments/Labs and Tests Ordered: Current medicines are reviewed at length with the patient today.  Concerns regarding medicines are outlined above.  No orders of the defined types were placed in this encounter.  Medication changes: No orders of the defined types were placed in this encounter.   Signed, Park Liter, MD, [facility] 03/10/2018 10:01 AM    [place]

## 2018-03-10 NOTE — Patient Instructions (Signed)
Medication Instructions:  Your physician recommends that you continue on your current medications as directed. Please refer to the Current Medication list given to you today.  Labwork: Your physician recommends that you return for lab work today: lipid panel.   Testing/Procedures: None  Follow-Up: Your physician wants you to follow-up in: 6 months. You will receive a reminder letter in the mail two months in advance. If you don't receive a letter, please call our office to schedule the follow-up appointment.   Any Other Special Instructions Will Be Listed Below (If Applicable).     If you need a refill on your cardiac medications before your next appointment, please call your pharmacy.

## 2018-03-11 ENCOUNTER — Encounter: Payer: Self-pay | Admitting: *Deleted

## 2018-03-11 ENCOUNTER — Telehealth: Payer: Self-pay | Admitting: *Deleted

## 2018-03-11 DIAGNOSIS — E785 Hyperlipidemia, unspecified: Secondary | ICD-10-CM

## 2018-03-11 LAB — LIPID PANEL
CHOLESTEROL TOTAL: 168 mg/dL (ref 100–199)
Chol/HDL Ratio: 4.2 ratio (ref 0.0–5.0)
HDL: 40 mg/dL (ref >39)
LDL Calculated: 96 mg/dL (ref 0–99)
TRIGLYCERIDES: 161 mg/dL — AB (ref 0–149)
VLDL CHOLESTEROL CAL: 32 mg/dL (ref 5–40)

## 2018-03-11 MED ORDER — ROSUVASTATIN CALCIUM 40 MG PO TABS: 40.0000 mg | ORAL_TABLET | Freq: Every day | ORAL | 3 refills | Status: AC

## 2018-03-11 NOTE — Telephone Encounter (Signed)
Patient informed of results and advised to increase crestor to 40 mg daily. Advised patient to go to [facility] in [place] in 6 weeks (the week of 04/22/2018) for repeat lab work. Patient verbalized understanding and requesting this information be sent to him in a letter. Letter has been sent. Refill for crestor sent to pharmacy. No further questions.

## 2018-03-11 NOTE — Telephone Encounter (Signed)
-----   Message from Park Liter, MD sent at 03/11/2018  8:56 AM EDT ----- Chol much better but still not where it should be, double crestor, flp ast alt in 6 weeks

## 2018-03-29 MED FILL — ROSUVASTATIN CALCIUM 40 MG: 40 | 90 days supply | Qty: 90 | Fill #0 | Status: TO

## 2018-03-31 MED FILL — LEVETIRACETAM ER 750 MG TAB: 750 | 30 days supply | Qty: 120 | Fill #0

## 2018-03-31 MED FILL — SHIPPING COST: 1 days supply | Qty: 1 | Fill #0

## 2018-04-29 MED FILL — LEVETIRACETAM ER 750 MG TAB: 750 | 30 days supply | Qty: 120 | Fill #0 | Status: TO

## 2018-05-12 NOTE — Addendum Note (Signed)
Addended by: Austin Miles on: 05/12/2018 02:56 PM   Modules accepted: Orders

## 2018-05-12 NOTE — Telephone Encounter (Signed)
Left message reminding him of follow up lab work that is overdue. Advised him to go to the [facility] in [place] this week to have this done.

## 2018-05-13 DIAGNOSIS — E785 Hyperlipidemia, unspecified: Secondary | ICD-10-CM | POA: Diagnosis not present

## 2018-05-14 LAB — LIPID PANEL
CHOL/HDL RATIO: 3.7 ratio (ref 0.0–5.0)
Cholesterol, Total: 144 mg/dL (ref 100–199)
HDL: 39 mg/dL — AB (ref >39)
LDL CALC: 66 mg/dL (ref 0–99)
TRIGLYCERIDES: 195 mg/dL — AB (ref 0–149)
VLDL Cholesterol Cal: 39 mg/dL (ref 5–40)

## 2018-06-07 MED FILL — ZONISAMIDE 50 MG CAPSULE: 50 | 90 days supply | Qty: 270 | Fill #0

## 2018-06-08 DIAGNOSIS — G40209 Localization-related (focal) (partial) symptomatic epilepsy and epileptic syndromes with complex partial seizures, not intractable, without status epilepticus: Secondary | ICD-10-CM | POA: Diagnosis not present

## 2018-06-08 DIAGNOSIS — G40309 Generalized idiopathic epilepsy and epileptic syndromes, not intractable, without status epilepticus: Secondary | ICD-10-CM | POA: Diagnosis not present

## 2018-06-08 MED FILL — SHIPPING COST: 1 days supply | Qty: 1 | Fill #1

## 2018-06-10 MED FILL — SHIPPING COST: 1 days supply | Qty: 1 | Fill #2

## 2018-06-10 MED FILL — ZALEPLON 10 MG CAPSULE: 10 | 30 days supply | Qty: 10 | Fill #0

## 2018-07-05 MED FILL — SHIPPING COST: 1 days supply | Qty: 1 | Fill #3

## 2018-07-05 MED FILL — ZALEPLON 10 MG CAPSULE: 10 | 30 days supply | Qty: 10 | Fill #1

## 2018-07-05 MED FILL — ROSUVASTATIN CALCIUM 40 MG: 40 | 90 days supply | Qty: 90 | Fill #0

## 2018-09-03 MED FILL — SHIPPING COST: 1 days supply | Qty: 1 | Fill #4

## 2018-09-03 MED FILL — ZONISAMIDE 50 MG CAPSULE: 50 | 90 days supply | Qty: 270 | Fill #1

## 2018-10-14 DIAGNOSIS — R197 Diarrhea, unspecified: Secondary | ICD-10-CM | POA: Diagnosis not present

## 2018-10-14 DIAGNOSIS — Z6825 Body mass index (BMI) 25.0-25.9, adult: Secondary | ICD-10-CM | POA: Diagnosis not present

## 2018-10-14 DIAGNOSIS — J0140 Acute pansinusitis, unspecified: Secondary | ICD-10-CM | POA: Diagnosis not present

## 2018-10-15 MED FILL — SHIPPING COST: 1 days supply | Qty: 1 | Fill #5

## 2018-10-15 MED FILL — ROSUVASTATIN CALCIUM 40 MG: 40 | 90 days supply | Qty: 90 | Fill #1

## 2018-12-09 MED FILL — ZONISAMIDE 50 MG CAPSULE: 50 | 90 days supply | Qty: 270 | Fill #0

## 2018-12-22 MED FILL — QUETIAPINE FUMARATE 50 MG T: 50 | 30 days supply | Qty: 30 | Fill #0

## 2018-12-22 MED FILL — SHIPPING COST: 1 days supply | Qty: 1 | Fill #6

## 2018-12-22 MED FILL — BRIVIACT 75 MG TABLET: 75 | 30 days supply | Qty: 60 | Fill #0

## 2019-01-17 MED FILL — ROSUVASTATIN CALCIUM 40 MG: 40 | 90 days supply | Qty: 90 | Fill #2

## 2019-01-17 MED FILL — SHIPPING COST: 1 days supply | Qty: 1 | Fill #7

## 2019-03-14 MED FILL — ZONISAMIDE 50 MG CAPSULE: 50 | 90 days supply | Qty: 270 | Fill #1

## 2019-09-19 DIAGNOSIS — R109 Unspecified abdominal pain: Secondary | ICD-10-CM | POA: Diagnosis not present

## 2019-09-19 DIAGNOSIS — N281 Cyst of kidney, acquired: Secondary | ICD-10-CM | POA: Diagnosis not present

## 2019-09-19 DIAGNOSIS — Z6827 Body mass index (BMI) 27.0-27.9, adult: Secondary | ICD-10-CM | POA: Diagnosis not present

## 2019-12-26 DIAGNOSIS — G4701 Insomnia due to medical condition: Secondary | ICD-10-CM | POA: Diagnosis not present

## 2019-12-26 DIAGNOSIS — G40309 Generalized idiopathic epilepsy and epileptic syndromes, not intractable, without status epilepticus: Secondary | ICD-10-CM | POA: Diagnosis not present

## 2020-03-12 DIAGNOSIS — Z79899 Other long term (current) drug therapy: Secondary | ICD-10-CM | POA: Diagnosis not present

## 2020-03-12 DIAGNOSIS — M25562 Pain in left knee: Secondary | ICD-10-CM | POA: Diagnosis not present

## 2020-12-20 DIAGNOSIS — G40309 Generalized idiopathic epilepsy and epileptic syndromes, not intractable, without status epilepticus: Secondary | ICD-10-CM | POA: Diagnosis not present

## 2020-12-20 DIAGNOSIS — G4700 Insomnia, unspecified: Secondary | ICD-10-CM | POA: Diagnosis not present

## 2021-09-05 DIAGNOSIS — R519 Headache, unspecified: Secondary | ICD-10-CM | POA: Diagnosis not present

## 2021-09-06 DIAGNOSIS — I6782 Cerebral ischemia: Secondary | ICD-10-CM | POA: Diagnosis not present

## 2021-09-06 DIAGNOSIS — Z739 Problem related to life management difficulty, unspecified: Secondary | ICD-10-CM | POA: Diagnosis not present

## 2021-09-06 DIAGNOSIS — Z8673 Personal history of transient ischemic attack (TIA), and cerebral infarction without residual deficits: Secondary | ICD-10-CM | POA: Diagnosis not present

## 2021-09-06 DIAGNOSIS — R519 Headache, unspecified: Secondary | ICD-10-CM | POA: Diagnosis not present

## 2021-09-16 DIAGNOSIS — Z125 Encounter for screening for malignant neoplasm of prostate: Secondary | ICD-10-CM | POA: Diagnosis not present

## 2021-09-16 DIAGNOSIS — Z6828 Body mass index (BMI) 28.0-28.9, adult: Secondary | ICD-10-CM | POA: Diagnosis not present

## 2021-09-16 DIAGNOSIS — M25561 Pain in right knee: Secondary | ICD-10-CM | POA: Diagnosis not present

## 2021-09-16 DIAGNOSIS — Z0001 Encounter for general adult medical examination with abnormal findings: Secondary | ICD-10-CM | POA: Diagnosis not present

## 2021-09-16 DIAGNOSIS — Z131 Encounter for screening for diabetes mellitus: Secondary | ICD-10-CM | POA: Diagnosis not present

## 2021-09-16 DIAGNOSIS — E785 Hyperlipidemia, unspecified: Secondary | ICD-10-CM | POA: Diagnosis not present

## 2021-09-16 DIAGNOSIS — Z1331 Encounter for screening for depression: Secondary | ICD-10-CM | POA: Diagnosis not present

## 2022-07-16 DIAGNOSIS — I10 Essential (primary) hypertension: Secondary | ICD-10-CM | POA: Diagnosis not present

## 2022-07-16 DIAGNOSIS — R569 Unspecified convulsions: Secondary | ICD-10-CM | POA: Diagnosis not present

## 2022-07-16 DIAGNOSIS — G40909 Epilepsy, unspecified, not intractable, without status epilepticus: Secondary | ICD-10-CM | POA: Diagnosis not present

## 2022-07-16 DIAGNOSIS — R000 Tachycardia, unspecified: Secondary | ICD-10-CM | POA: Diagnosis not present

## 2022-11-04 DIAGNOSIS — Z125 Encounter for screening for malignant neoplasm of prostate: Secondary | ICD-10-CM | POA: Diagnosis not present

## 2022-11-04 DIAGNOSIS — Z0000 Encounter for general adult medical examination without abnormal findings: Secondary | ICD-10-CM | POA: Diagnosis not present

## 2022-11-04 DIAGNOSIS — Z1331 Encounter for screening for depression: Secondary | ICD-10-CM | POA: Diagnosis not present

## 2022-11-04 DIAGNOSIS — R7309 Other abnormal glucose: Secondary | ICD-10-CM | POA: Diagnosis not present

## 2022-11-04 DIAGNOSIS — Z6827 Body mass index (BMI) 27.0-27.9, adult: Secondary | ICD-10-CM | POA: Diagnosis not present

## 2022-12-24 DIAGNOSIS — G4700 Insomnia, unspecified: Secondary | ICD-10-CM | POA: Diagnosis not present

## 2022-12-24 DIAGNOSIS — Z79899 Other long term (current) drug therapy: Secondary | ICD-10-CM | POA: Diagnosis not present

## 2022-12-24 DIAGNOSIS — G43909 Migraine, unspecified, not intractable, without status migrainosus: Secondary | ICD-10-CM | POA: Diagnosis not present

## 2022-12-24 DIAGNOSIS — R569 Unspecified convulsions: Secondary | ICD-10-CM | POA: Diagnosis not present

## 2023-02-12 DIAGNOSIS — E785 Hyperlipidemia, unspecified: Secondary | ICD-10-CM | POA: Diagnosis not present

## 2023-02-12 DIAGNOSIS — Z79899 Other long term (current) drug therapy: Secondary | ICD-10-CM | POA: Diagnosis not present

## 2023-03-02 DIAGNOSIS — R519 Headache, unspecified: Secondary | ICD-10-CM | POA: Diagnosis not present

## 2023-03-02 DIAGNOSIS — J019 Acute sinusitis, unspecified: Secondary | ICD-10-CM | POA: Diagnosis not present

## 2023-03-02 DIAGNOSIS — J029 Acute pharyngitis, unspecified: Secondary | ICD-10-CM | POA: Diagnosis not present

## 2023-03-02 DIAGNOSIS — J9801 Acute bronchospasm: Secondary | ICD-10-CM | POA: Diagnosis not present

## 2024-02-29 ENCOUNTER — Other Ambulatory Visit (HOSPITAL_COMMUNITY): Payer: Self-pay

## 2024-02-29 ENCOUNTER — Other Ambulatory Visit: Payer: Self-pay

## 2024-02-29 DIAGNOSIS — R251 Tremor, unspecified: Secondary | ICD-10-CM | POA: Diagnosis not present

## 2024-02-29 DIAGNOSIS — G40309 Generalized idiopathic epilepsy and epileptic syndromes, not intractable, without status epilepticus: Secondary | ICD-10-CM | POA: Diagnosis not present

## 2024-02-29 DIAGNOSIS — G4709 Other insomnia: Secondary | ICD-10-CM | POA: Diagnosis not present

## 2024-02-29 DIAGNOSIS — G4701 Insomnia due to medical condition: Secondary | ICD-10-CM | POA: Diagnosis not present

## 2024-02-29 MED ORDER — ZONISAMIDE 50 MG PO CAPS
150.0000 mg | ORAL_CAPSULE | Freq: Every day | ORAL | 3 refills | Status: AC
  Filled 2024-02-29 – 2024-04-26 (×2): qty 270, 90d supply, fill #0
  Filled 2024-07-22: qty 270, 90d supply, fill #1
  Filled 2024-10-22: qty 270, 90d supply, fill #2

## 2024-02-29 MED ORDER — ZOLMITRIPTAN 5 MG PO TABS
5.0000 mg | ORAL_TABLET | ORAL | 3 refills | Status: AC | PRN
  Filled 2024-02-29: qty 12, 20d supply, fill #0
  Filled 2024-04-26: qty 8, 30d supply, fill #0
  Filled 2024-05-11: qty 12, 30d supply, fill #0

## 2024-02-29 MED ORDER — LEVETIRACETAM ER 750 MG PO TB24
3000.0000 mg | ORAL_TABLET | Freq: Every day | ORAL | 11 refills | Status: AC
  Filled 2024-02-29 – 2024-04-26 (×2): qty 360, 90d supply, fill #0
  Filled 2024-07-22: qty 360, 90d supply, fill #1
  Filled 2024-10-22: qty 360, 90d supply, fill #2

## 2024-03-03 DIAGNOSIS — Z6829 Body mass index (BMI) 29.0-29.9, adult: Secondary | ICD-10-CM | POA: Diagnosis not present

## 2024-03-03 DIAGNOSIS — G4700 Insomnia, unspecified: Secondary | ICD-10-CM | POA: Diagnosis not present

## 2024-03-03 DIAGNOSIS — R251 Tremor, unspecified: Secondary | ICD-10-CM | POA: Diagnosis not present

## 2024-03-03 DIAGNOSIS — Z79899 Other long term (current) drug therapy: Secondary | ICD-10-CM | POA: Diagnosis not present

## 2024-03-03 DIAGNOSIS — F419 Anxiety disorder, unspecified: Secondary | ICD-10-CM | POA: Diagnosis not present

## 2024-03-10 ENCOUNTER — Other Ambulatory Visit (HOSPITAL_COMMUNITY): Payer: Self-pay

## 2024-03-17 DIAGNOSIS — G250 Essential tremor: Secondary | ICD-10-CM | POA: Diagnosis not present

## 2024-03-17 DIAGNOSIS — Z6829 Body mass index (BMI) 29.0-29.9, adult: Secondary | ICD-10-CM | POA: Diagnosis not present

## 2024-03-17 DIAGNOSIS — F419 Anxiety disorder, unspecified: Secondary | ICD-10-CM | POA: Diagnosis not present

## 2024-03-17 DIAGNOSIS — G4700 Insomnia, unspecified: Secondary | ICD-10-CM | POA: Diagnosis not present

## 2024-03-31 DIAGNOSIS — F419 Anxiety disorder, unspecified: Secondary | ICD-10-CM | POA: Diagnosis not present

## 2024-03-31 DIAGNOSIS — G40909 Epilepsy, unspecified, not intractable, without status epilepticus: Secondary | ICD-10-CM | POA: Diagnosis not present

## 2024-03-31 DIAGNOSIS — R410 Disorientation, unspecified: Secondary | ICD-10-CM | POA: Diagnosis not present

## 2024-03-31 DIAGNOSIS — Z6828 Body mass index (BMI) 28.0-28.9, adult: Secondary | ICD-10-CM | POA: Diagnosis not present

## 2024-03-31 DIAGNOSIS — G250 Essential tremor: Secondary | ICD-10-CM | POA: Diagnosis not present

## 2024-03-31 DIAGNOSIS — F5101 Primary insomnia: Secondary | ICD-10-CM | POA: Diagnosis not present

## 2024-04-07 ENCOUNTER — Other Ambulatory Visit: Payer: Self-pay | Admitting: Internal Medicine

## 2024-04-07 DIAGNOSIS — G250 Essential tremor: Secondary | ICD-10-CM

## 2024-04-07 DIAGNOSIS — G40909 Epilepsy, unspecified, not intractable, without status epilepticus: Secondary | ICD-10-CM | POA: Diagnosis not present

## 2024-04-07 DIAGNOSIS — J811 Chronic pulmonary edema: Secondary | ICD-10-CM | POA: Diagnosis not present

## 2024-04-07 DIAGNOSIS — R569 Unspecified convulsions: Secondary | ICD-10-CM | POA: Diagnosis not present

## 2024-04-07 DIAGNOSIS — Z7982 Long term (current) use of aspirin: Secondary | ICD-10-CM | POA: Diagnosis not present

## 2024-04-07 DIAGNOSIS — R410 Disorientation, unspecified: Secondary | ICD-10-CM

## 2024-04-07 DIAGNOSIS — Z79899 Other long term (current) drug therapy: Secondary | ICD-10-CM | POA: Diagnosis not present

## 2024-04-07 DIAGNOSIS — R000 Tachycardia, unspecified: Secondary | ICD-10-CM | POA: Diagnosis not present

## 2024-04-14 ENCOUNTER — Encounter: Payer: Self-pay | Admitting: Internal Medicine

## 2024-04-14 DIAGNOSIS — Z6829 Body mass index (BMI) 29.0-29.9, adult: Secondary | ICD-10-CM | POA: Diagnosis not present

## 2024-04-14 DIAGNOSIS — F419 Anxiety disorder, unspecified: Secondary | ICD-10-CM | POA: Diagnosis not present

## 2024-04-14 DIAGNOSIS — G40909 Epilepsy, unspecified, not intractable, without status epilepticus: Secondary | ICD-10-CM | POA: Diagnosis not present

## 2024-04-14 DIAGNOSIS — F5101 Primary insomnia: Secondary | ICD-10-CM | POA: Diagnosis not present

## 2024-04-14 DIAGNOSIS — G250 Essential tremor: Secondary | ICD-10-CM | POA: Diagnosis not present

## 2024-04-15 ENCOUNTER — Encounter: Payer: Self-pay | Admitting: Internal Medicine

## 2024-04-19 ENCOUNTER — Encounter: Payer: Self-pay | Admitting: Internal Medicine

## 2024-04-26 ENCOUNTER — Other Ambulatory Visit (HOSPITAL_COMMUNITY): Payer: Self-pay

## 2024-04-26 ENCOUNTER — Other Ambulatory Visit: Payer: Self-pay

## 2024-04-27 ENCOUNTER — Other Ambulatory Visit (HOSPITAL_COMMUNITY): Payer: Self-pay

## 2024-04-27 ENCOUNTER — Other Ambulatory Visit: Payer: Self-pay

## 2024-04-27 MED ORDER — BUSPIRONE HCL 5 MG PO TABS
5.0000 mg | ORAL_TABLET | Freq: Two times a day (BID) | ORAL | 1 refills | Status: DC
  Filled 2024-04-27: qty 60, 30d supply, fill #0

## 2024-05-11 ENCOUNTER — Other Ambulatory Visit: Payer: Self-pay

## 2024-05-11 ENCOUNTER — Other Ambulatory Visit (HOSPITAL_COMMUNITY): Payer: Self-pay

## 2024-05-13 ENCOUNTER — Ambulatory Visit
Admission: RE | Admit: 2024-05-13 | Discharge: 2024-05-13 | Disposition: A | Discharge status: Home / Self Care | Source: Ambulatory Visit | Attending: Internal Medicine | Admitting: Internal Medicine

## 2024-05-13 DIAGNOSIS — R569 Unspecified convulsions: Secondary | ICD-10-CM | POA: Diagnosis not present

## 2024-05-13 DIAGNOSIS — G250 Essential tremor: Secondary | ICD-10-CM

## 2024-05-13 DIAGNOSIS — R410 Disorientation, unspecified: Secondary | ICD-10-CM

## 2024-05-13 DIAGNOSIS — G40909 Epilepsy, unspecified, not intractable, without status epilepticus: Secondary | ICD-10-CM

## 2024-05-13 MED ORDER — GADOPICLENOL 0.5 MMOL/ML IV SOLN
10.0000 mL | Freq: Once | INTRAVENOUS | Status: AC | PRN
  Administered 2024-05-13: 10 mL via INTRAVENOUS

## 2024-05-19 ENCOUNTER — Other Ambulatory Visit (HOSPITAL_COMMUNITY): Payer: Self-pay

## 2024-05-19 DIAGNOSIS — F419 Anxiety disorder, unspecified: Secondary | ICD-10-CM | POA: Diagnosis not present

## 2024-05-19 DIAGNOSIS — Z6829 Body mass index (BMI) 29.0-29.9, adult: Secondary | ICD-10-CM | POA: Diagnosis not present

## 2024-05-19 DIAGNOSIS — G4700 Insomnia, unspecified: Secondary | ICD-10-CM | POA: Diagnosis not present

## 2024-05-19 DIAGNOSIS — E785 Hyperlipidemia, unspecified: Secondary | ICD-10-CM | POA: Diagnosis not present

## 2024-05-19 DIAGNOSIS — G250 Essential tremor: Secondary | ICD-10-CM | POA: Diagnosis not present

## 2024-05-20 ENCOUNTER — Other Ambulatory Visit (HOSPITAL_COMMUNITY): Payer: Self-pay

## 2024-05-25 ENCOUNTER — Other Ambulatory Visit: Payer: Self-pay

## 2024-05-25 ENCOUNTER — Other Ambulatory Visit (HOSPITAL_COMMUNITY): Payer: Self-pay

## 2024-05-26 ENCOUNTER — Other Ambulatory Visit (HOSPITAL_COMMUNITY): Payer: Self-pay

## 2024-05-27 ENCOUNTER — Other Ambulatory Visit (HOSPITAL_COMMUNITY): Payer: Self-pay

## 2024-05-28 ENCOUNTER — Other Ambulatory Visit (HOSPITAL_COMMUNITY): Payer: Self-pay

## 2024-05-28 ENCOUNTER — Other Ambulatory Visit (HOSPITAL_BASED_OUTPATIENT_CLINIC_OR_DEPARTMENT_OTHER): Payer: Self-pay

## 2024-05-28 MED ORDER — PRIMIDONE 50 MG PO TABS
25.0000 mg | ORAL_TABLET | Freq: Every evening | ORAL | 1 refills | Status: DC
  Filled 2024-05-28: qty 45, 90d supply, fill #0
  Filled 2024-08-10: qty 45, 90d supply, fill #1

## 2024-05-28 MED ORDER — BUSPIRONE HCL 5 MG PO TABS
5.0000 mg | ORAL_TABLET | Freq: Two times a day (BID) | ORAL | 1 refills | Status: AC
  Filled 2024-05-28: qty 180, 90d supply, fill #0
  Filled 2024-07-25 – 2024-08-11 (×2): qty 180, 90d supply, fill #1

## 2024-05-28 MED ORDER — PROPRANOLOL HCL 10 MG PO TABS
10.0000 mg | ORAL_TABLET | Freq: Two times a day (BID) | ORAL | 1 refills | Status: DC
  Filled 2024-05-28: qty 180, 90d supply, fill #0
  Filled 2024-08-23: qty 180, 90d supply, fill #1

## 2024-05-28 MED ORDER — ROSUVASTATIN CALCIUM 10 MG PO TABS
10.0000 mg | ORAL_TABLET | Freq: Every day | ORAL | 3 refills | Status: AC
  Filled 2024-05-28: qty 90, 90d supply, fill #0
  Filled 2024-08-13: qty 90, 90d supply, fill #1
  Filled 2024-11-19: qty 90, 90d supply, fill #2

## 2024-05-28 MED ORDER — ZOLPIDEM TARTRATE 10 MG PO TABS
10.0000 mg | ORAL_TABLET | Freq: Every evening | ORAL | 0 refills | Status: AC | PRN
  Filled 2024-05-28 – 2024-06-12 (×2): qty 90, 90d supply, fill #0

## 2024-05-28 MED ORDER — DIAZEPAM 10 MG PO TABS
5.0000 mg | ORAL_TABLET | Freq: Three times a day (TID) | ORAL | 1 refills | Status: DC
  Filled 2024-05-28 – 2024-06-08 (×3): qty 90, 30d supply, fill #0
  Filled 2024-07-08: qty 90, 30d supply, fill #1

## 2024-05-30 ENCOUNTER — Other Ambulatory Visit: Payer: Self-pay

## 2024-05-30 ENCOUNTER — Other Ambulatory Visit (HOSPITAL_COMMUNITY): Payer: Self-pay

## 2024-05-30 MED ORDER — BUSPIRONE HCL 5 MG PO TABS
5.0000 mg | ORAL_TABLET | Freq: Two times a day (BID) | ORAL | 1 refills | Status: AC
  Filled 2024-05-30 – 2024-12-03 (×3): qty 180, 90d supply, fill #0

## 2024-06-02 ENCOUNTER — Other Ambulatory Visit (HOSPITAL_COMMUNITY): Payer: Self-pay

## 2024-06-06 ENCOUNTER — Other Ambulatory Visit (HOSPITAL_COMMUNITY): Payer: Self-pay

## 2024-06-08 ENCOUNTER — Other Ambulatory Visit: Payer: Self-pay

## 2024-06-08 ENCOUNTER — Other Ambulatory Visit (HOSPITAL_COMMUNITY): Payer: Self-pay

## 2024-06-13 ENCOUNTER — Other Ambulatory Visit: Payer: Self-pay

## 2024-06-13 ENCOUNTER — Other Ambulatory Visit (HOSPITAL_COMMUNITY): Payer: Self-pay

## 2024-06-27 ENCOUNTER — Other Ambulatory Visit (HOSPITAL_COMMUNITY): Payer: Self-pay

## 2024-07-08 ENCOUNTER — Other Ambulatory Visit: Payer: Self-pay

## 2024-07-08 ENCOUNTER — Other Ambulatory Visit (HOSPITAL_COMMUNITY): Payer: Self-pay

## 2024-07-22 ENCOUNTER — Other Ambulatory Visit (HOSPITAL_COMMUNITY): Payer: Self-pay

## 2024-07-22 ENCOUNTER — Other Ambulatory Visit: Payer: Self-pay

## 2024-07-25 ENCOUNTER — Other Ambulatory Visit (HOSPITAL_COMMUNITY): Payer: Self-pay

## 2024-08-02 ENCOUNTER — Other Ambulatory Visit (HOSPITAL_COMMUNITY): Payer: Self-pay

## 2024-08-09 ENCOUNTER — Other Ambulatory Visit (HOSPITAL_COMMUNITY): Payer: Self-pay

## 2024-08-10 ENCOUNTER — Other Ambulatory Visit: Payer: Self-pay

## 2024-08-10 ENCOUNTER — Other Ambulatory Visit (HOSPITAL_COMMUNITY): Payer: Self-pay

## 2024-08-10 MED ORDER — DIAZEPAM 10 MG PO TABS
ORAL_TABLET | ORAL | 1 refills | Status: DC
  Filled 2024-08-10: qty 90, 30d supply, fill #0
  Filled 2024-09-05 – 2024-09-07 (×2): qty 90, 30d supply, fill #1

## 2024-08-11 ENCOUNTER — Other Ambulatory Visit (HOSPITAL_COMMUNITY): Payer: Self-pay

## 2024-08-11 ENCOUNTER — Other Ambulatory Visit: Payer: Self-pay

## 2024-08-13 ENCOUNTER — Other Ambulatory Visit (HOSPITAL_COMMUNITY): Payer: Self-pay

## 2024-08-23 ENCOUNTER — Other Ambulatory Visit (HOSPITAL_COMMUNITY): Payer: Self-pay

## 2024-09-05 ENCOUNTER — Other Ambulatory Visit (HOSPITAL_COMMUNITY): Payer: Self-pay

## 2024-09-07 ENCOUNTER — Other Ambulatory Visit: Payer: Self-pay

## 2024-09-21 ENCOUNTER — Other Ambulatory Visit: Payer: Self-pay

## 2024-09-21 ENCOUNTER — Other Ambulatory Visit (HOSPITAL_COMMUNITY): Payer: Self-pay

## 2024-09-21 MED ORDER — RAMELTEON 8 MG PO TABS
8.0000 mg | ORAL_TABLET | Freq: Every evening | ORAL | 1 refills | Status: DC | PRN
  Filled 2024-09-21: qty 30, 30d supply, fill #0
  Filled 2024-11-10: qty 30, 30d supply, fill #1

## 2024-09-21 MED ORDER — PRIMIDONE 50 MG PO TABS
50.0000 mg | ORAL_TABLET | Freq: Every evening | ORAL | 1 refills | Status: AC
  Filled 2024-09-21: qty 90, 90d supply, fill #0
  Filled 2024-12-27: qty 90, 90d supply, fill #1

## 2024-10-09 ENCOUNTER — Other Ambulatory Visit (HOSPITAL_COMMUNITY): Payer: Self-pay

## 2024-10-13 ENCOUNTER — Other Ambulatory Visit (HOSPITAL_COMMUNITY): Payer: Self-pay

## 2024-10-13 ENCOUNTER — Other Ambulatory Visit: Payer: Self-pay

## 2024-10-13 MED ORDER — DIAZEPAM 10 MG PO TABS
5.0000 mg | ORAL_TABLET | Freq: Three times a day (TID) | ORAL | 1 refills | Status: DC | PRN
  Filled 2024-10-13: qty 90, 30d supply, fill #0
  Filled 2024-11-12: qty 90, 30d supply, fill #1

## 2024-10-22 ENCOUNTER — Other Ambulatory Visit (HOSPITAL_COMMUNITY): Payer: Self-pay

## 2024-10-24 ENCOUNTER — Other Ambulatory Visit: Payer: Self-pay

## 2024-11-11 ENCOUNTER — Other Ambulatory Visit (HOSPITAL_COMMUNITY): Payer: Self-pay

## 2024-11-12 ENCOUNTER — Other Ambulatory Visit (HOSPITAL_COMMUNITY): Payer: Self-pay

## 2024-11-14 ENCOUNTER — Other Ambulatory Visit: Payer: Self-pay

## 2024-11-19 ENCOUNTER — Other Ambulatory Visit (HOSPITAL_COMMUNITY): Payer: Self-pay

## 2024-12-03 ENCOUNTER — Other Ambulatory Visit (HOSPITAL_COMMUNITY): Payer: Self-pay

## 2024-12-04 ENCOUNTER — Other Ambulatory Visit (HOSPITAL_COMMUNITY): Payer: Self-pay

## 2024-12-04 MED ORDER — PROPRANOLOL HCL 10 MG PO TABS
10.0000 mg | ORAL_TABLET | ORAL | 1 refills | Status: AC
  Filled 2024-12-04 – 2024-12-05 (×2): qty 180, 90d supply, fill #0

## 2024-12-05 ENCOUNTER — Other Ambulatory Visit (HOSPITAL_COMMUNITY): Payer: Self-pay

## 2024-12-05 ENCOUNTER — Other Ambulatory Visit: Payer: Self-pay

## 2024-12-06 ENCOUNTER — Other Ambulatory Visit: Payer: Self-pay

## 2024-12-12 ENCOUNTER — Other Ambulatory Visit (HOSPITAL_COMMUNITY): Payer: Self-pay

## 2024-12-12 MED ORDER — RAMELTEON 8 MG PO TABS
ORAL_TABLET | ORAL | 1 refills | Status: AC
  Filled 2024-12-12: qty 30, 30d supply, fill #0
  Filled 2025-01-04: qty 30, 30d supply, fill #1

## 2024-12-13 ENCOUNTER — Other Ambulatory Visit: Payer: Self-pay

## 2024-12-14 ENCOUNTER — Other Ambulatory Visit: Payer: Self-pay

## 2024-12-14 ENCOUNTER — Other Ambulatory Visit (HOSPITAL_COMMUNITY): Payer: Self-pay

## 2024-12-14 MED ORDER — DIAZEPAM 10 MG PO TABS
ORAL_TABLET | ORAL | 1 refills | Status: AC
  Filled 2024-12-14: qty 90, 30d supply, fill #0
  Filled 2025-01-10 – 2025-01-13 (×2): qty 90, 30d supply, fill #1
  Filled 2025-01-13: qty 90, 30d supply, fill #0

## 2024-12-27 ENCOUNTER — Other Ambulatory Visit: Payer: Self-pay

## 2025-01-05 ENCOUNTER — Other Ambulatory Visit: Payer: Self-pay

## 2025-01-10 ENCOUNTER — Other Ambulatory Visit (HOSPITAL_COMMUNITY): Payer: Self-pay

## 2025-01-11 ENCOUNTER — Other Ambulatory Visit (HOSPITAL_COMMUNITY): Payer: Self-pay

## 2025-01-11 ENCOUNTER — Other Ambulatory Visit: Payer: Self-pay

## 2025-01-13 ENCOUNTER — Other Ambulatory Visit (HOSPITAL_COMMUNITY): Payer: Self-pay

## 2025-01-13 ENCOUNTER — Other Ambulatory Visit: Payer: Self-pay

## 2025-01-13 ENCOUNTER — Other Ambulatory Visit (HOSPITAL_BASED_OUTPATIENT_CLINIC_OR_DEPARTMENT_OTHER): Payer: Self-pay
